# Patient Record
Sex: Male | Born: 1980 | Race: White | Hispanic: No | Marital: Married | Smoking: Never smoker
Health system: Southern US, Community
[De-identification: ages and names within clinical notes are randomized; demographics above are authoritative.]

## PROBLEM LIST (undated history)

## (undated) DIAGNOSIS — Z8719 Personal history of other diseases of the digestive system: Secondary | ICD-10-CM

## (undated) DIAGNOSIS — J302 Other seasonal allergic rhinitis: Secondary | ICD-10-CM

## (undated) DIAGNOSIS — K625 Hemorrhage of anus and rectum: Secondary | ICD-10-CM

## (undated) DIAGNOSIS — Z87898 Personal history of other specified conditions: Secondary | ICD-10-CM

## (undated) DIAGNOSIS — K649 Unspecified hemorrhoids: Secondary | ICD-10-CM

---

## 2006-05-24 ENCOUNTER — Emergency Department: Payer: Self-pay | Admitting: Unknown Physician Specialty

## 2007-03-30 ENCOUNTER — Emergency Department: Payer: Self-pay | Admitting: Emergency Medicine

## 2008-10-15 ENCOUNTER — Emergency Department: Payer: Self-pay | Admitting: Emergency Medicine

## 2011-12-01 ENCOUNTER — Emergency Department: Payer: Self-pay | Admitting: Emergency Medicine

## 2011-12-01 LAB — CK TOTAL AND CKMB (NOT AT ARMC)
CK, Total: 66 U/L (ref 35–232)
CK-MB: 2 ng/mL (ref 0.5–3.6)

## 2012-09-03 ENCOUNTER — Ambulatory Visit: Payer: Self-pay | Admitting: Family Medicine

## 2013-02-19 ENCOUNTER — Emergency Department: Payer: Self-pay | Admitting: Emergency Medicine

## 2013-02-19 LAB — BASIC METABOLIC PANEL
ANION GAP: 3 — AB (ref 7–16)
BUN: 18 mg/dL (ref 7–18)
CHLORIDE: 103 mmol/L (ref 98–107)
Calcium, Total: 9.2 mg/dL (ref 8.5–10.1)
Co2: 33 mmol/L — ABNORMAL HIGH (ref 21–32)
Creatinine: 1.08 mg/dL (ref 0.60–1.30)
EGFR (Non-African Amer.): >60
GLUCOSE: 121 mg/dL — AB (ref 65–99)
OSMOLALITY: 281 (ref 275–301)
POTASSIUM: 3.9 mmol/L (ref 3.5–5.1)
SODIUM: 139 mmol/L (ref 136–145)

## 2013-02-19 LAB — CBC
HCT: 47.6 % (ref 40.0–52.0)
HGB: 15.8 g/dL (ref 13.0–18.0)
MCH: 29.1 pg (ref 26.0–34.0)
MCHC: 33.3 g/dL (ref 32.0–36.0)
MCV: 88 fL (ref 80–100)
PLATELETS: 230 10*3/uL (ref 150–440)
RBC: 5.44 10*6/uL (ref 4.40–5.90)
RDW: 13 % (ref 11.5–14.5)
WBC: 5.9 10*3/uL (ref 3.8–10.6)

## 2013-02-19 LAB — URINALYSIS, COMPLETE
BLOOD: NEGATIVE
Bacteria: NONE SEEN
Bilirubin,UR: NEGATIVE
Glucose,UR: NEGATIVE mg/dL (ref 0–75)
Ketone: NEGATIVE
LEUKOCYTE ESTERASE: NEGATIVE
Nitrite: NEGATIVE
Ph: 6 (ref 4.5–8.0)
Protein: NEGATIVE
RBC,UR: 1 /HPF (ref 0–5)
Specific Gravity: 1.02 (ref 1.003–1.030)
Squamous Epithelial: <1
WBC UR: 1 /HPF (ref 0–5)

## 2013-02-21 LAB — URINE CULTURE

## 2014-05-21 NOTE — Discharge Instructions (Signed)

## 2014-05-24 ENCOUNTER — Ambulatory Visit: Payer: Managed Care, Other (non HMO) | Admitting: Anesthesiology

## 2014-05-24 ENCOUNTER — Encounter
Admission: RE | Disposition: A | Discharge status: Home / Self Care | Payer: Self-pay | Source: Ambulatory Visit | Attending: Gastroenterology

## 2014-05-24 ENCOUNTER — Ambulatory Visit
Admission: RE | Admit: 2014-05-24 | Discharge: 2014-05-24 | Disposition: A | Discharge status: Home / Self Care | Payer: Managed Care, Other (non HMO) | Source: Ambulatory Visit | Attending: Gastroenterology | Admitting: Gastroenterology

## 2014-05-24 DIAGNOSIS — R1031 Right lower quadrant pain: Secondary | ICD-10-CM | POA: Insufficient documentation

## 2014-05-24 DIAGNOSIS — K641 Second degree hemorrhoids: Secondary | ICD-10-CM | POA: Insufficient documentation

## 2014-05-24 DIAGNOSIS — K921 Melena: Secondary | ICD-10-CM | POA: Insufficient documentation

## 2014-05-24 DIAGNOSIS — R1032 Left lower quadrant pain: Secondary | ICD-10-CM | POA: Insufficient documentation

## 2014-05-24 HISTORY — DX: Unspecified hemorrhoids: K64.9

## 2014-05-24 HISTORY — DX: Hemorrhage of anus and rectum: K62.5

## 2014-05-24 HISTORY — DX: Personal history of other diseases of the digestive system: Z87.19

## 2014-05-24 HISTORY — DX: Other seasonal allergic rhinitis: J30.2

## 2014-05-24 HISTORY — DX: Personal history of other specified conditions: Z87.898

## 2014-05-24 HISTORY — PX: COLONOSCOPY: SHX5424

## 2014-05-24 SURGERY — COLONOSCOPY
Anesthesia: Monitor Anesthesia Care | Wound class: Contaminated

## 2014-05-24 MED ORDER — FENTANYL CITRATE (PF) 100 MCG/2ML IJ SOLN: 25.0000 ug | INTRAMUSCULAR | Status: DC | PRN

## 2014-05-24 MED ORDER — LACTATED RINGERS IV SOLN
INTRAVENOUS | Status: DC
  Administered 2014-05-24: 11:00:00 via INTRAVENOUS

## 2014-05-24 MED ORDER — OXYCODONE HCL 5 MG PO TABS: 5.0000 mg | ORAL_TABLET | Freq: Once | ORAL | Status: DC | PRN

## 2014-05-24 MED ORDER — ACETAMINOPHEN 325 MG PO TABS
325.0000 mg | ORAL_TABLET | ORAL | Status: DC | PRN
Start: 2014-05-24 — End: 2014-05-24

## 2014-05-24 MED ORDER — LIDOCAINE HCL (CARDIAC) 20 MG/ML IV SOLN
INTRAVENOUS | Status: DC | PRN
  Administered 2014-05-24: 50 mg via INTRAVENOUS

## 2014-05-24 MED ORDER — STERILE WATER FOR IRRIGATION IR SOLN
Status: DC | PRN
  Administered 2014-05-24: 11:00:00

## 2014-05-24 MED ORDER — OXYCODONE HCL 5 MG/5ML PO SOLN: 5.0000 mg | Freq: Once | ORAL | Status: DC | PRN

## 2014-05-24 MED ORDER — PROPOFOL 10 MG/ML IV BOLUS
INTRAVENOUS | Status: DC | PRN
  Administered 2014-05-24 (×2): 50 mg via INTRAVENOUS
  Administered 2014-05-24 (×2): 100 mg via INTRAVENOUS
  Administered 2014-05-24 (×2): 50 mg via INTRAVENOUS

## 2014-05-24 MED ORDER — ACETAMINOPHEN 160 MG/5ML PO SOLN: 325.0000 mg | ORAL | Status: DC | PRN

## 2014-05-24 MED ORDER — DEXAMETHASONE SODIUM PHOSPHATE 4 MG/ML IJ SOLN: 8.0000 mg | Freq: Once | INTRAMUSCULAR | Status: DC | PRN

## 2014-05-24 SURGICAL SUPPLY — 27 items
CANISTER SUCT 1200ML W/VALVE (MISCELLANEOUS) ×3 IMPLANT
FCP ESCP3.2XJMB 240X2.8X (MISCELLANEOUS)
FORCEPS BIOP RAD 4 LRG CAP 4 (CUTTING FORCEPS) IMPLANT
FORCEPS BIOP RJ4 240 W/NDL (MISCELLANEOUS)
FORCEPS ESCP3.2XJMB 240X2.8X (MISCELLANEOUS) IMPLANT
GOWN CVR UNV OPN BCK APRN NK (MISCELLANEOUS) ×2 IMPLANT
GOWN ISOL THUMB LOOP REG UNIV (MISCELLANEOUS) ×4
HEMOCLIP INSTINCT (CLIP) IMPLANT
INJECTOR VARIJECT VIN23 (MISCELLANEOUS) IMPLANT
KIT CO2 TUBING (TUBING) ×3 IMPLANT
KIT DEFENDO VALVE AND CONN (KITS) IMPLANT
KIT ENDO PROCEDURE OLY (KITS) ×3 IMPLANT
LIGATOR MULTIBAND 6SHOOTER MBL (MISCELLANEOUS) IMPLANT
MARKER SPOT ENDO TATTOO 5ML (MISCELLANEOUS) IMPLANT
PAD GROUND ADULT SPLIT (MISCELLANEOUS) IMPLANT
SNARE SHORT THROW 13M SML OVAL (MISCELLANEOUS) IMPLANT
SNARE SHORT THROW 30M LRG OVAL (MISCELLANEOUS) IMPLANT
SPOT EX ENDOSCOPIC TATTOO (MISCELLANEOUS)
SUCTION POLY TRAP 4CHAMBER (MISCELLANEOUS) IMPLANT
TRAP SUCTION POLY (MISCELLANEOUS) IMPLANT
TUBING CONN 6MMX3.1M (TUBING)
TUBING SUCTION CONN 0.25 STRL (TUBING) IMPLANT
UNDERPAD 30X60 958B10 (PK) (MISCELLANEOUS) IMPLANT
VALVE BIOPSY ENDO (VALVE) IMPLANT
VARIJECT INJECTOR VIN23 (MISCELLANEOUS)
WATER AUXILLARY (MISCELLANEOUS) IMPLANT
WATER STERILE IRR 500ML POUR (IV SOLUTION) ×3 IMPLANT

## 2014-05-24 NOTE — Transfer of Care (Signed)
Immediate Anesthesia Transfer of Care Note  Patient: Calvin Castro  Procedure(s) Performed: Procedure(s) with comments: COLONOSCOPY (N/A) - cecum time- 1113  Patient Location: PACU  Anesthesia Type: MAC  Level of Consciousness: awake, alert  and patient cooperative  Airway and Oxygen Therapy: Patient Spontanous Breathing and Patient connected to supplemental oxygen  Post-op Assessment: Post-op Vital signs reviewed, Patient's Cardiovascular Status Stable, Respiratory Function Stable, Patent Airway and No signs of Nausea or vomiting  Post-op Vital Signs: Reviewed and stable  Complications: No apparent anesthesia complications

## 2014-05-24 NOTE — Anesthesia Procedure Notes (Signed)
Procedure Name: MAC Performed by: Amoree Newlon Pre-anesthesia Checklist: Patient identified, Emergency Drugs available, Suction available, Timeout performed and Patient being monitored Patient Re-evaluated:Patient Re-evaluated prior to inductionOxygen Delivery Method: Nasal cannula Placement Confirmation: positive ETCO2     

## 2014-05-24 NOTE — Anesthesia Postprocedure Evaluation (Signed)
  Anesthesia Post-op Note  Patient: Calvin Castro  Procedure(s) Performed: Procedure(s) with comments: COLONOSCOPY (N/A) - cecum time- 1113  Anesthesia type:MAC  Patient location: PACU  Post pain: Pain level controlled  Post assessment: Post-op Vital signs reviewed, Patient's Cardiovascular Status Stable, Respiratory Function Stable, Patent Airway and No signs of Nausea or vomiting  Post vital signs: Reviewed and stable  Last Vitals:  Filed Vitals:   05/24/14 1123  BP:   Pulse:   Temp: 36.7 C  Resp:     Level of consciousness: awake, alert  and patient cooperative  Complications: No apparent anesthesia complications

## 2014-05-24 NOTE — Anesthesia Preprocedure Evaluation (Signed)
Anesthesia Evaluation  Patient identified by MRN, date of birth, ID band Patient awake    Reviewed: Allergy & Precautions, H&P , NPO status , Patient's Chart, lab work & pertinent test results, reviewed documented beta blocker date and time   Airway Mallampati: II  TM Distance: >3 FB Neck ROM: full    Dental no notable dental hx.    Pulmonary neg pulmonary ROS,  breath sounds clear to auscultation  Pulmonary exam normal       Cardiovascular Exercise Tolerance: Good negative cardio ROS  Rhythm:regular Rate:Normal     Neuro/Psych negative neurological ROS  negative psych ROS   GI/Hepatic negative GI ROS, Neg liver ROS,   Endo/Other  negative endocrine ROS  Renal/GU negative Renal ROS  negative genitourinary   Musculoskeletal   Abdominal   Peds  Hematology negative hematology ROS (+)   Anesthesia Other Findings   Reproductive/Obstetrics negative OB ROS                             Anesthesia Physical Anesthesia Plan  ASA: II  Anesthesia Plan: MAC   Post-op Pain Management:    Induction:   Airway Management Planned:   Additional Equipment:   Intra-op Plan:   Post-operative Plan:   Informed Consent: I have reviewed the patients History and Physical, chart, labs and discussed the procedure including the risks, benefits and alternatives for the proposed anesthesia with the patient or authorized representative who has indicated his/her understanding and acceptance.     Plan Discussed with: CRNA  Anesthesia Plan Comments:         Anesthesia Quick Evaluation

## 2014-05-24 NOTE — Op Note (Signed)
[facility] Patient Name: Calvin Castro Procedure Date: 05/24/2014 10:57 AM MRN: [id number] Account #: [id number] Date of Birth: 1980-07-15 Admit Type: Outpatient Age: 34 [facility] Gender: Male Note Status: Finalized Procedure:         Colonoscopy Indications:       Abdominal pain in the left lower quadrant, Abdominal pain                     in the right lower quadrant, Hematochezia Providers:         Midge Miniumarren Bhargav Barbaro, MD Referring MD:      Janeann ForehandJames H. Hawkins Jr, MD (Referring MD) Medicines:         Propofol per Anesthesia Complications:     No immediate complications. Procedure:         Pre-Anesthesia Assessment:                    - Prior to the procedure, a History and Physical was                     performed, and patient medications and allergies were                     reviewed. The patient's tolerance of previous anesthesia                     was also reviewed. The risks and benefits of the procedure                     and the sedation options and risks were discussed with the                     patient. All questions were answered, and informed consent                     was obtained. Prior Anticoagulants: The patient has taken                     no previous anticoagulant or antiplatelet agents. ASA                     Grade Assessment: I - A normal, healthy patient. After                     reviewing the risks and benefits, the patient was deemed                     in satisfactory condition to undergo the procedure.                    After obtaining informed consent, the colonoscope was                     passed under direct vision. Throughout the procedure, the                     patient's blood pressure, pulse, and oxygen saturations                     were monitored continuously. The Olympus CF H180AL                     colonoscope (S#: [id number]) was introduced through the anus  and advanced to the  the cecum, identified by appendiceal                     orifice and ileocecal valve. The colonoscopy was performed                     without difficulty. The patient tolerated the procedure                     well. The quality of the bowel preparation was excellent. Findings:      The perianal and digital rectal examinations were normal.      Non-bleeding internal hemorrhoids were found during retroflexion. The       hemorrhoids were Grade II (internal hemorrhoids that prolapse but reduce       spontaneously). Impression:        - Non-bleeding internal hemorrhoids.                    - No specimens collected. Recommendation:    - High fiber diet. Procedure Code(s): --- Professional ---                    [phone], Colonoscopy, flexible; diagnostic, including                     collection of specimen(s) by brushing or washing, when                     performed (separate procedure) Diagnosis Code(s): --- Professional ---                    R10.32, Left lower quadrant pain                    R10.31, Right lower quadrant pain                    K92.1, Melena CPT copyright 2014 American Medical Association. All rights reserved. The codes documented in this report are preliminary and upon coder review may  be revised to meet current compliance requirements. Midge Miniumarren Ozelle Brubacher, MD 05/24/2014 11:21:54 AM This report has been signed electronically. Number of Addenda: 0 Note Initiated On: 05/24/2014 10:57 AM Scope Withdrawal Time: 0 hours 6 minutes 37 seconds  Total Procedure Duration: 0 hours 12 minutes 58 seconds       [facility]

## 2014-05-26 ENCOUNTER — Encounter: Payer: Self-pay | Admitting: Gastroenterology

## 2015-02-08 ENCOUNTER — Ambulatory Visit (INDEPENDENT_AMBULATORY_CARE_PROVIDER_SITE_OTHER): Payer: Managed Care, Other (non HMO) | Admitting: Family Medicine

## 2015-02-08 VITALS — BP 112/73 | HR 82 | Temp 98.1°F | Resp 16 | Ht 70.0 in | Wt 138.6 lb

## 2015-02-08 DIAGNOSIS — R197 Diarrhea, unspecified: Secondary | ICD-10-CM | POA: Diagnosis not present

## 2015-02-08 DIAGNOSIS — R1030 Lower abdominal pain, unspecified: Secondary | ICD-10-CM

## 2015-02-08 DIAGNOSIS — R109 Unspecified abdominal pain: Secondary | ICD-10-CM

## 2015-02-08 LAB — POCT URINALYSIS DIPSTICK
BILIRUBIN UA: NEGATIVE
Glucose, UA: NEGATIVE
KETONES UA: NEGATIVE
Leukocytes, UA: NEGATIVE
NITRITE UA: NEGATIVE
PH UA: 5
Protein, UA: NEGATIVE
RBC UA: NEGATIVE
SPEC GRAV UA: 1.015
Urobilinogen, UA: NEGATIVE

## 2015-02-08 NOTE — Patient Instructions (Signed)
Lets get some lab work to work up the source of your symptoms. I also think a CT scan of your abdomen might be helpful to see what is going on.  IF you have severe abdominal pain, nausea, vomiting, blood in urine or stool- please seek immediate medical attention.

## 2015-02-08 NOTE — Progress Notes (Signed)
Subjective:    Patient ID: Calvin Castro, male    DOB: [birth year], 35 y.o.   MRN: [id number]  HPI: Calvin Castro is a 35 y.o. male presenting on 02/08/2015 for Abdominal Pain   HPI  Pt presents for lower abdominal pain R> L.- pain- radiates to groin at times. Symptoms have been present for a while- pain is intermittent. Gets sharp pain radiating from flankHe is also having back pain. Back pain is present on and off for about 6 mos. Colonoscopy in May 2016 for rectal bleeding and hemorrhoids.  It was normal. Was told to increase fiber in his diet.   No dysuria, no blood in the urine. R flank pain radiating to the groin.  BM's- Pain on R side- has large BM after-no blood in the stool. Loose BM.    Has been sleepy and not motivated. Feels like his blood sugars are dropping.   Past Medical History  Diagnosis Date  . Seasonal allergies   . Rectal bleeding   . Hemorrhoids   . History of IBS   . Hx of diarrhea     No current outpatient prescriptions on file prior to visit.   No current facility-administered medications on file prior to visit.    Review of Systems  Constitutional: Negative for fever and chills.  HENT: Negative.   Respiratory: Negative for chest tightness, shortness of breath and wheezing.   Cardiovascular: Negative for chest pain, palpitations and leg swelling.  Gastrointestinal: Positive for abdominal pain and anal bleeding (occasional smears from anal fissures. ). Negative for nausea, vomiting and blood in stool.  Endocrine: Negative.   Genitourinary: Positive for flank pain. Negative for dysuria, urgency, discharge, difficulty urinating, penile pain and testicular pain.  Musculoskeletal: Negative for back pain, joint swelling and arthralgias.  Skin: Negative.   Neurological: Negative for dizziness, weakness, numbness and headaches.  Psychiatric/Behavioral: Negative for sleep disturbance and dysphoric mood.   Per HPI unless specifically indicated above       Objective:    BP 112/73 mmHg  Pulse 82  Temp(Src) 98.1 F (36.7 C) (Oral)  Resp 16  Ht  (1.778 m)  Wt 138 lb 9.6 oz (62.869 kg)  BMI 19.89 kg/m2  Wt Readings from Last 3 Encounters:  02/08/15 138 lb 9.6 oz (62.869 kg)  05/24/14 127 lb (57.607 kg)    Physical Exam  Constitutional: He is oriented to person, place, and time. He appears well-developed and well-nourished. No distress.  HENT:  Head: Normocephalic and atraumatic.  Neck: Neck supple. No thyromegaly present.  Cardiovascular: Normal rate, regular rhythm and normal heart sounds.  Exam reveals no gallop and no friction rub.   No murmur heard. Pulmonary/Chest: Effort normal and breath sounds normal. He has no wheezes.  Abdominal: Soft. Bowel sounds are normal. He exhibits no distension. There is tenderness in the right lower quadrant. There is CVA tenderness (L and R). There is no rebound.  Musculoskeletal: Normal range of motion. He exhibits no edema or tenderness.  Neurological: He is alert and oriented to person, place, and time. He has normal reflexes.  Skin: Skin is warm and dry. No rash noted. No erythema.  Psychiatric: He has a normal mood and affect. His behavior is normal. Thought content normal.   Results for orders placed or performed in visit on 02/08/15  POCT Urinalysis Dipstick  Result Value Ref Range   Color, UA dark yellow    Clarity, UA clear    Glucose, UA negative  Bilirubin, UA negative    Ketones, UA negative    Spec Grav, UA 1.015    Blood, UA negative    pH, UA 5.0    Protein, UA negative    Urobilinogen, UA negative    Nitrite, UA negative    Leukocytes, UA Negative Negative      Assessment & Plan:   Problem List Items Addressed This Visit    None    Visit Diagnoses    Lower abdominal pain    -  Primary    Kidney stone vs. IBS- CMET, CBC, Consider CT imaging of abdomen. Colonscopy normal. Consider GI referral for IBS work-up.    Relevant Orders    POCT Urinalysis Dipstick  (Completed)    Comprehensive metabolic panel    CBC with Differential/Platelet    Amylase    Lipase    Right flank pain        Existing for months- possible kidney stone vs infection. Urine culture pending. Consider CT.     Relevant Orders    CULTURE, URINE COMPREHENSIVE    Diarrhea, unspecified type        Relevant Orders    TSH       No orders of the defined types were placed in this encounter.      Follow up plan: Return in about 2 weeks (around 02/22/2015) for abdominal pain. Marland Kitchen

## 2015-02-10 LAB — CULTURE, URINE COMPREHENSIVE

## 2015-02-11 LAB — COMPREHENSIVE METABOLIC PANEL
ALK PHOS: 64 IU/L (ref 39–117)
ALT: 14 IU/L (ref 0–44)
AST: 15 IU/L (ref 0–40)
Albumin/Globulin Ratio: 2.1 (ref 1.1–2.5)
Albumin: 4.8 g/dL (ref 3.5–5.5)
BUN / CREAT RATIO: 19 (ref 8–19)
BUN: 16 mg/dL (ref 6–20)
Bilirubin Total: 0.8 mg/dL (ref 0.0–1.2)
CO2: 26 mmol/L (ref 18–29)
CREATININE: 0.85 mg/dL (ref 0.76–1.27)
Calcium: 9.4 mg/dL (ref 8.7–10.2)
Chloride: 98 mmol/L (ref 96–106)
GFR calc Af Amer: 132 mL/min/{1.73_m2} (ref >59)
GFR calc non Af Amer: 114 mL/min/{1.73_m2} (ref >59)
GLUCOSE: 111 mg/dL — AB (ref 65–99)
Globulin, Total: 2.3 g/dL (ref 1.5–4.5)
Potassium: 4.1 mmol/L (ref 3.5–5.2)
Sodium: 142 mmol/L (ref 134–144)
Total Protein: 7.1 g/dL (ref 6.0–8.5)

## 2015-02-11 LAB — CBC WITH DIFFERENTIAL/PLATELET
Basophils Absolute: 0 10*3/uL (ref 0.0–0.2)
Basos: 1 %
EOS (ABSOLUTE): 0.2 10*3/uL (ref 0.0–0.4)
EOS: 4 %
HEMATOCRIT: 45.7 % (ref 37.5–51.0)
HEMOGLOBIN: 15.3 g/dL (ref 12.6–17.7)
IMMATURE GRANS (ABS): 0 10*3/uL (ref 0.0–0.1)
Immature Granulocytes: 0 %
LYMPHS ABS: 1.6 10*3/uL (ref 0.7–3.1)
LYMPHS: 30 %
MCH: 29.1 pg (ref 26.6–33.0)
MCHC: 33.5 g/dL (ref 31.5–35.7)
MCV: 87 fL (ref 79–97)
MONOCYTES: 7 %
Monocytes Absolute: 0.4 10*3/uL (ref 0.1–0.9)
Neutrophils Absolute: 3.2 10*3/uL (ref 1.4–7.0)
Neutrophils: 58 %
PLATELETS: 230 10*3/uL (ref 150–379)
RBC: 5.25 x10E6/uL (ref 4.14–5.80)
RDW: 13.3 % (ref 12.3–15.4)
WBC: 5.4 10*3/uL (ref 3.4–10.8)

## 2015-02-11 LAB — AMYLASE: Amylase: 51 U/L (ref 31–124)

## 2015-02-11 LAB — LIPASE: Lipase: 32 U/L (ref 0–59)

## 2015-02-11 LAB — TSH: TSH: 1.51 u[IU]/mL (ref 0.450–4.500)

## 2015-02-14 ENCOUNTER — Telehealth: Payer: Self-pay | Admitting: Family Medicine

## 2015-02-14 NOTE — Telephone Encounter (Signed)
Called pt regarding lab results and next steps.

## 2015-02-16 LAB — SPECIMEN STATUS REPORT

## 2015-02-16 LAB — HGB A1C W/O EAG: Hgb A1c MFr Bld: 5.4 % (ref 4.8–5.6)

## 2015-02-16 NOTE — Telephone Encounter (Signed)
Called pt to review lab results. LMTCB.  If he calls back, please let him know his A1c is normal. No diabetes.  Also- the plan was to order a CT of his abdomen to work-up the abdominal pain. Then proceed with GI consult if we could not find anything. He is welcome to move up his appt if he doesn't want to wait until Monday. I would be happy to work him in on Thursday or Friday at anytime. Thanks! AK

## 2015-02-16 NOTE — Telephone Encounter (Signed)
Calvin Castro left messege and pt called as per Calvin Castro we can schedule his  appointment on 02/17/2015 at 8 or 8:30 am LMTCB and Liborio Nixon aware

## 2015-02-17 ENCOUNTER — Ambulatory Visit (INDEPENDENT_AMBULATORY_CARE_PROVIDER_SITE_OTHER): Payer: Managed Care, Other (non HMO) | Admitting: Family Medicine

## 2015-02-17 VITALS — BP 108/70 | HR 65 | Temp 97.7°F | Resp 16 | Ht 70.0 in | Wt 138.0 lb

## 2015-02-17 DIAGNOSIS — R1030 Lower abdominal pain, unspecified: Secondary | ICD-10-CM | POA: Diagnosis not present

## 2015-02-17 DIAGNOSIS — K589 Irritable bowel syndrome without diarrhea: Secondary | ICD-10-CM

## 2015-02-17 DIAGNOSIS — G8929 Other chronic pain: Secondary | ICD-10-CM | POA: Diagnosis not present

## 2015-02-17 DIAGNOSIS — R1011 Right upper quadrant pain: Secondary | ICD-10-CM | POA: Diagnosis not present

## 2015-02-17 DIAGNOSIS — R109 Unspecified abdominal pain: Principal | ICD-10-CM

## 2015-02-17 MED ORDER — CULTURELLE DIGESTIVE HEALTH PO CAPS: 1.0000 | ORAL_CAPSULE | Freq: Every day | ORAL | Status: DC

## 2015-02-17 MED ORDER — MELOXICAM 15 MG PO TABS: 15.0000 mg | ORAL_TABLET | Freq: Every day | ORAL | Status: DC

## 2015-02-17 MED ORDER — AMITRIPTYLINE HCL 10 MG PO TABS: 10.0000 mg | ORAL_TABLET | Freq: Every day | ORAL | Status: DC

## 2015-02-17 NOTE — Assessment & Plan Note (Signed)
Obtain CT imaging to rule out kidney stone or abdominal issue as source of pain. Trial of mleoxicam to relieve pain.

## 2015-02-17 NOTE — Progress Notes (Signed)
Subjective:    Patient ID: Calvin Castro, male    DOB: 11/21/1980, 35 y.o.   MRN: [id number]  HPI: Calvin Castro is a 35 y.o. male presenting on 02/17/2015 for Back Pain   HPI  Pt presents for follow-up and R flank and abdominal pain. Pain is intermittent and radiates from R flank to lower abdomen. Dull ache in the R flank. Not affected by movement. Abdominal pain is not constant- occurs in the low abdomen. He cannot make a pattern with what he eats. Has used bentyl in the past for abdominal pain and it has never helped. Abdominal pain feels better after a bowel movement.   Pt does report feeling a little anxious about results.   Past Medical History  Diagnosis Date  . Seasonal allergies   . Rectal bleeding   . Hemorrhoids   . History of IBS   . Hx of diarrhea     No current outpatient prescriptions on file prior to visit.   No current facility-administered medications on file prior to visit.    Review of Systems  Constitutional: Negative for fever and chills.  HENT: Negative.   Respiratory: Negative for chest tightness, shortness of breath and wheezing.   Cardiovascular: Negative for chest pain, palpitations and leg swelling.  Gastrointestinal: Positive for abdominal pain. Negative for nausea, vomiting and constipation.  Endocrine: Negative.   Genitourinary: Positive for flank pain. Negative for dysuria, urgency, discharge, penile pain and testicular pain.  Musculoskeletal: Negative for back pain, joint swelling and arthralgias.  Skin: Negative.   Neurological: Negative for dizziness, weakness, numbness and headaches.  Psychiatric/Behavioral: Negative for sleep disturbance and dysphoric mood. The patient is nervous/anxious.    Per HPI unless specifically indicated above     Objective:    BP 108/70 mmHg  Pulse 65  Temp(Src) 97.7 F (36.5 C) (Oral)  Resp 16  Ht  (1.778 m)  Wt 138 lb (62.596 kg)  BMI 19.80 kg/m2  Wt Readings from Last 3 Encounters:  02/17/15  138 lb (62.596 kg)  02/08/15 138 lb 9.6 oz (62.869 kg)  05/24/14 127 lb (57.607 kg)    Physical Exam  Constitutional: He is oriented to person, place, and time. He appears well-developed and well-nourished. No distress.  HENT:  Head: Normocephalic and atraumatic.  Neck: Neck supple. No thyromegaly present.  Cardiovascular: Normal rate, regular rhythm and normal heart sounds.  Exam reveals no gallop and no friction rub.   No murmur heard. Pulmonary/Chest: Effort normal and breath sounds normal. He has no wheezes.  Abdominal: Soft. Bowel sounds are normal. He exhibits no distension. There is no tenderness. There is CVA tenderness. There is no rebound.  Musculoskeletal: Normal range of motion. He exhibits no edema or tenderness.  Neurological: He is alert and oriented to person, place, and time. He has normal reflexes.  Skin: Skin is warm and dry. No rash noted. No erythema.  Psychiatric: He has a normal mood and affect. His behavior is normal. Thought content normal.   Results for orders placed or performed in visit on 02/08/15  CULTURE, URINE COMPREHENSIVE  Result Value Ref Range   Urine Culture, Comprehensive Final report    Result 1 Comment   Comprehensive metabolic panel  Result Value Ref Range   Glucose 111 (H) 65 - 99 mg/dL   BUN 16 6 - 20 mg/dL   Creatinine, Ser 8.46 0.76 - 1.27 mg/dL   GFR calc non Af Amer 114 >59 mL/min/1.73   GFR calc Af Denyse Dago  132 >59 mL/min/1.73   BUN/Creatinine Ratio 19 8 - 19   Sodium 142 134 - 144 mmol/L   Potassium 4.1 3.5 - 5.2 mmol/L   Chloride 98 96 - 106 mmol/L   CO2 26 18 - 29 mmol/L   Calcium 9.4 8.7 - 10.2 mg/dL   Total Protein 7.1 6.0 - 8.5 g/dL   Albumin 4.8 3.5 - 5.5 g/dL   Globulin, Total 2.3 1.5 - 4.5 g/dL   Albumin/Globulin Ratio 2.1 1.1 - 2.5   Bilirubin Total 0.8 0.0 - 1.2 mg/dL   Alkaline Phosphatase 64 39 - 117 IU/L   AST 15 0 - 40 IU/L   ALT 14 0 - 44 IU/L  CBC with Differential/Platelet  Result Value Ref Range   WBC 5.4 3.4  - 10.8 x10E3/uL   RBC 5.25 4.14 - 5.80 x10E6/uL   Hemoglobin 15.3 12.6 - 17.7 g/dL   Hematocrit 08.6 57.8 - 51.0 %   MCV 87 79 - 97 fL   MCH 29.1 26.6 - 33.0 pg   MCHC 33.5 31.5 - 35.7 g/dL   RDW 46.9 62.9 - 52.8 %   Platelets 230 150 - 379 x10E3/uL   Neutrophils 58 %   Lymphs 30 %   Monocytes 7 %   Eos 4 %   Basos 1 %   Neutrophils Absolute 3.2 1.4 - 7.0 x10E3/uL   Lymphocytes Absolute 1.6 0.7 - 3.1 x10E3/uL   Monocytes Absolute 0.4 0.1 - 0.9 x10E3/uL   EOS (ABSOLUTE) 0.2 0.0 - 0.4 x10E3/uL   Basophils Absolute 0.0 0.0 - 0.2 x10E3/uL   Immature Granulocytes 0 %   Immature Grans (Abs) 0.0 0.0 - 0.1 x10E3/uL  Amylase  Result Value Ref Range   Amylase 51 31 - 124 U/L  Lipase  Result Value Ref Range   Lipase 32 0 - 59 U/L  TSH  Result Value Ref Range   TSH 1.510 0.450 - 4.500 uIU/mL  Hgb A1c w/o eAG  Result Value Ref Range   Hgb A1c MFr Bld 5.4 4.8 - 5.6 %  Specimen status report  Result Value Ref Range   specimen status report Comment   POCT Urinalysis Dipstick  Result Value Ref Range   Color, UA dark yellow    Clarity, UA clear    Glucose, UA negative    Bilirubin, UA negative    Ketones, UA negative    Spec Grav, UA 1.015    Blood, UA negative    pH, UA 5.0    Protein, UA negative    Urobilinogen, UA negative    Nitrite, UA negative    Leukocytes, UA Negative Negative      Assessment & Plan:   Problem List Items Addressed This Visit      Digestive   IBS (irritable bowel syndrome)    Symptoms consistent with IBS as triggered by anxiety. Trial of amitriptyline  at bedtime to help with sleep and abdominal symptoms.  Recommend probiotic. Trial of fodmap diet.  Trial of gluten free.       Relevant Medications   Lactobacillus-Inulin (CULTURELLE DIGESTIVE HEALTH) CAPS   amitriptyline (ELAVIL) 10 MG tablet     Other   Right flank pain, chronic - Primary    Obtain CT imaging to rule out kidney stone or abdominal issue as source of pain. Trial of  mleoxicam to relieve pain.       Relevant Medications   meloxicam (MOBIC) 15 MG tablet   Other Relevant Orders   CT Abdomen  Pelvis W Contrast   Lower abdominal pain    CT scan to evaluate abdominal pain. Colonoscopy last year was WNL. Likely IBS or diet related.        Relevant Orders   CT Abdomen Pelvis W Contrast      Meds ordered this encounter  Medications  . meloxicam (MOBIC) 15 MG tablet    Sig: Take 1 tablet (15 mg total) by mouth daily.    Dispense:  30 tablet    Refill:  0    Order Specific Question:  Supervising Provider    Answer:  Janeann Forehand [phone]  . Lactobacillus-Inulin (CULTURELLE DIGESTIVE HEALTH) CAPS    Sig: Take 1 capsule by mouth daily.    Order Specific Question:  Supervising Provider    Answer:  Janeann Forehand [[id number]]  . amitriptyline (ELAVIL) 10 MG tablet    Sig: Take 1 tablet (10 mg total) by mouth at bedtime.    Dispense:  30 tablet    Refill:  11    Order Specific Question:  Supervising Provider    Answer:  Janeann Forehand [phone]      Follow up plan: Return in about 4 weeks (around 03/17/2015) for IBS.Marland Kitchen

## 2015-02-17 NOTE — Assessment & Plan Note (Signed)
Symptoms consistent with IBS as triggered by anxiety. Trial of amitriptyline  at bedtime to help with sleep and abdominal symptoms.  Recommend probiotic. Trial of fodmap diet.  Trial of gluten free.

## 2015-02-17 NOTE — Patient Instructions (Addendum)
Let's try amitriptyline at bedtime to see if it helps your symptoms.   Try taking a probiotic daily.   Let's try meloxicam for the back pain.  It may help you feel better.   We will obtain imaging of your belly and your back.

## 2015-02-17 NOTE — Assessment & Plan Note (Signed)
CT scan to evaluate abdominal pain. Colonoscopy last year was WNL. Likely IBS or diet related.

## 2015-02-21 ENCOUNTER — Telehealth: Payer: Self-pay | Admitting: Family Medicine

## 2015-02-21 ENCOUNTER — Ambulatory Visit: Payer: Managed Care, Other (non HMO) | Admitting: Family Medicine

## 2015-02-21 NOTE — Telephone Encounter (Signed)
Pt. States that the medication that was given to him on  Friday Elavil  10 mg  Was  causing his arms to go numb, from elbow to hand to turn red,hard time  Sleeping pt call back # is  [phone]

## 2015-02-21 NOTE — Telephone Encounter (Signed)
Pt stopped taking due to side effects. Pt does not want to start a new medication at this time. We will proceed with imaging of his belly.

## 2015-03-02 ENCOUNTER — Telehealth: Payer: Self-pay | Admitting: Family Medicine

## 2015-03-02 NOTE — Telephone Encounter (Signed)
Tried x 2 to contact [place] w/o success. Authorization information has been entered in order for CT.

## 2015-03-02 NOTE — Telephone Encounter (Signed)
[facility] said pt's needs a precert of CT scheduled for tomorrow.  Her call back number is [phone]

## 2015-03-02 NOTE — Telephone Encounter (Signed)
Calvin Castro in scheduling at [facility] said it was past cut off time for scheduled appt tomorrow.  After precert is done, they will reschedule.  Victorino Dike has a few questions, please call [phone]

## 2015-03-02 NOTE — Telephone Encounter (Signed)
R/T call to Kylie to let her know patient has been approved for CT scan though it has been cancelled. Auth # [id number] Valid 2/15-5/16/17 Case # [id number] DX. R10.11

## 2015-03-03 ENCOUNTER — Ambulatory Visit: Admission: RE | Admit: 2015-03-03 | Payer: Managed Care, Other (non HMO) | Source: Ambulatory Visit

## 2015-03-04 NOTE — Telephone Encounter (Signed)
Left patient detailed message letting him know CT has been approved. He needs to call scheduling back to reschedule procedure.

## 2015-05-09 IMAGING — CT CT STONE STUDY
1 of 4 series · 4 of 46 positions shown, 9 images · non-contrast
Comparison: None.

CLINICAL DATA: Right flank pain.

EXAM:
CT ABDOMEN AND PELVIS WITHOUT CONTRAST
TECHNIQUE: Multidetector CT imaging of the abdomen and pelvis was performed
following the standard protocol without IV contrast.

[Series 4: lung windows · axial · 0.57mm/px · z∈[-612,-552]mm · 4 of 21 slices shown, 9 images]
[im 5/21  soft-tissue]
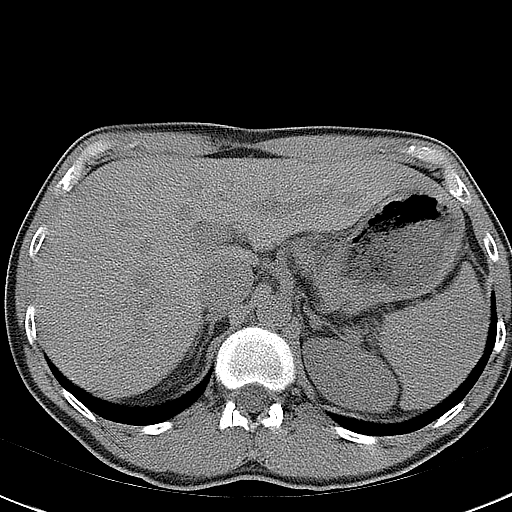
[im 5/21  lung]
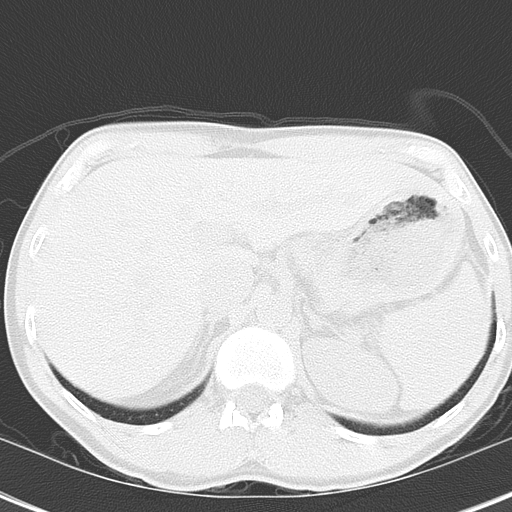
[im 5/21  bone]
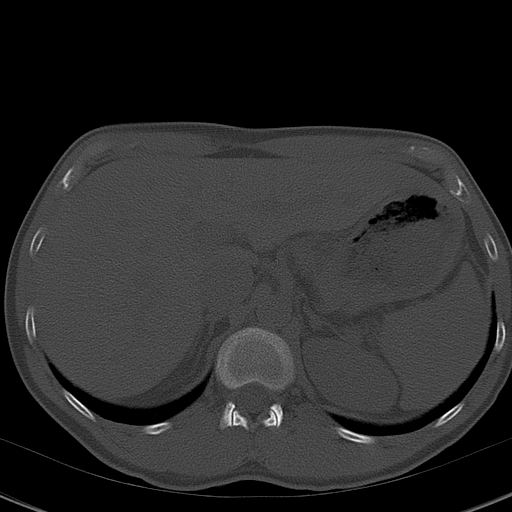
[im 9/21  soft-tissue]
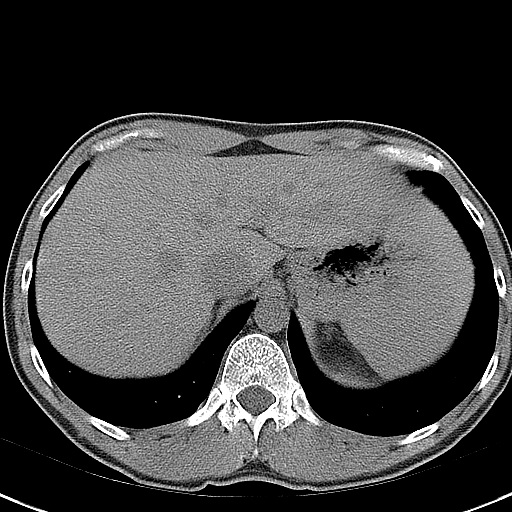
[im 9/21  lung]
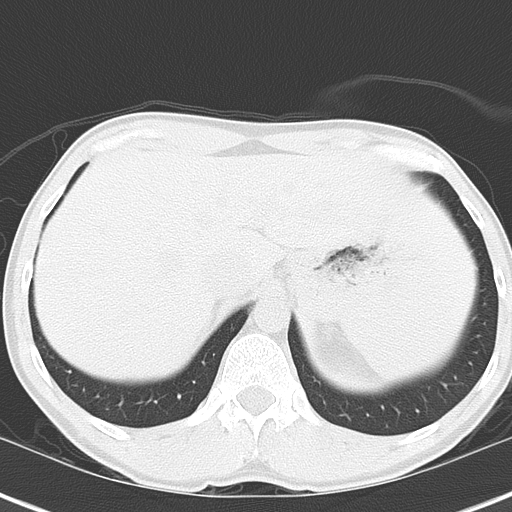
[im 13/21  soft-tissue]
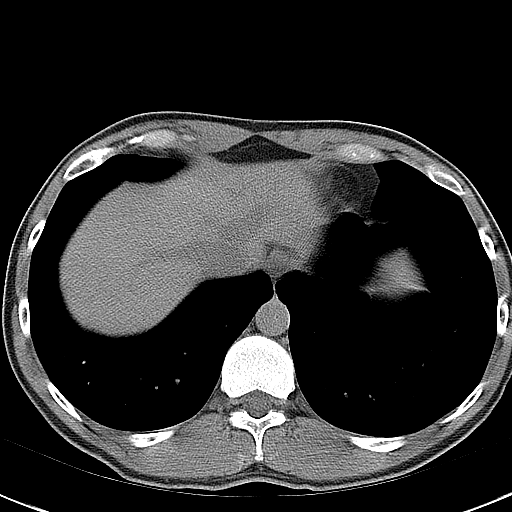
[im 13/21  lung]
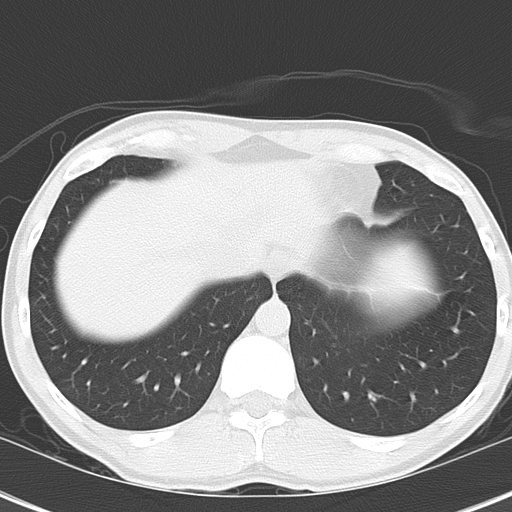
[im 17/21  soft-tissue]
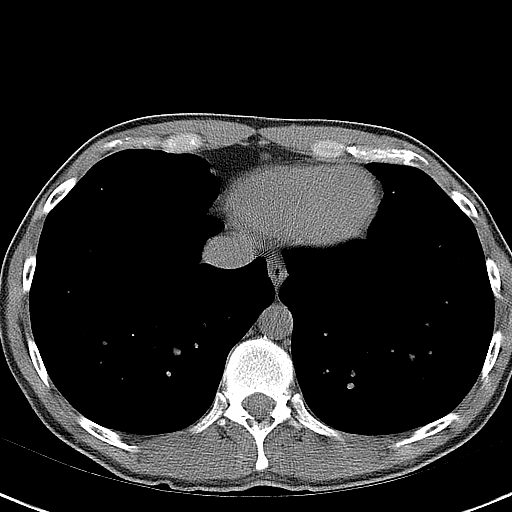
[im 17/21  lung]
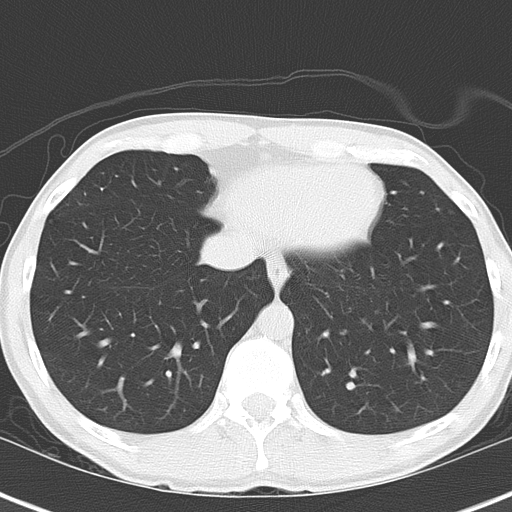

[4 of 46 positions shown; findings below may reference images not displayed]

FINDINGS: The lung bases are clear.  No pleural or pericardial effusion.

There is no hydronephrosis on the right or left. No renal or
ureteral stones are identified.

The gallbladder, liver, spleen, adrenal glands and pancreas appear
normal. The appendix is visualized and appears normal. The stomach
and small and large bowel appear normal. There is no lymphadenopathy
or fluid. No bony abnormality is identified.
IMPRESSION: Negative for urinary tract stone.  Negative CT abdomen and pelvis.

## 2016-06-11 ENCOUNTER — Encounter: Payer: Self-pay | Admitting: *Deleted

## 2016-06-11 ENCOUNTER — Ambulatory Visit
Admission: EM | Admit: 2016-06-11 | Discharge: 2016-06-11 | Disposition: A | Discharge status: Home / Self Care | Payer: Managed Care, Other (non HMO) | Attending: Family Medicine | Admitting: Family Medicine

## 2016-06-11 DIAGNOSIS — R112 Nausea with vomiting, unspecified: Secondary | ICD-10-CM | POA: Diagnosis not present

## 2016-06-11 LAB — COMPREHENSIVE METABOLIC PANEL
ALBUMIN: 4.6 g/dL (ref 3.5–5.0)
ALK PHOS: 61 U/L (ref 38–126)
ALT: 16 U/L — ABNORMAL LOW (ref 17–63)
ANION GAP: 9 (ref 5–15)
AST: 24 U/L (ref 15–41)
BUN: 15 mg/dL (ref 6–20)
CALCIUM: 8.7 mg/dL — AB (ref 8.9–10.3)
CO2: 25 mmol/L (ref 22–32)
Chloride: 102 mmol/L (ref 101–111)
Creatinine, Ser: 0.96 mg/dL (ref 0.61–1.24)
Glucose, Bld: 122 mg/dL — ABNORMAL HIGH (ref 65–99)
Potassium: 3.1 mmol/L — ABNORMAL LOW (ref 3.5–5.1)
Sodium: 136 mmol/L (ref 135–145)
TOTAL PROTEIN: 7.8 g/dL (ref 6.5–8.1)
Total Bilirubin: 1.3 mg/dL — ABNORMAL HIGH (ref 0.3–1.2)

## 2016-06-11 LAB — CBC WITH DIFFERENTIAL/PLATELET
BASOS PCT: 0 %
Basophils Absolute: 0 10*3/uL (ref 0–0.1)
Eosinophils Absolute: 0 10*3/uL (ref 0–0.7)
Eosinophils Relative: 0 %
HCT: 48 % (ref 40.0–52.0)
Hemoglobin: 16.3 g/dL (ref 13.0–18.0)
LYMPHS ABS: 0.4 10*3/uL — AB (ref 1.0–3.6)
Lymphocytes Relative: 6 %
MCH: 29 pg (ref 26.0–34.0)
MCHC: 34 g/dL (ref 32.0–36.0)
MCV: 85.3 fL (ref 80.0–100.0)
MONO ABS: 0.6 10*3/uL (ref 0.2–1.0)
Monocytes Relative: 8 %
Neutro Abs: 5.8 10*3/uL (ref 1.4–6.5)
Neutrophils Relative %: 86 %
Platelets: 183 10*3/uL (ref 150–440)
RBC: 5.63 MIL/uL (ref 4.40–5.90)
RDW: 13.1 % (ref 11.5–14.5)
WBC: 6.8 10*3/uL (ref 3.8–10.6)

## 2016-06-11 MED ORDER — ONDANSETRON 8 MG PO TBDP: 8.0000 mg | ORAL_TABLET | Freq: Three times a day (TID) | ORAL | 0 refills | Status: DC | PRN

## 2016-06-11 MED ORDER — ONDANSETRON 8 MG PO TBDP
8.0000 mg | ORAL_TABLET | Freq: Once | ORAL | Status: AC
  Administered 2016-06-11: 8 mg via ORAL

## 2016-06-11 NOTE — Discharge Instructions (Signed)
Pedialyte or Gatorade, clear liquids then advance diet slowly as tolerated Go to Emergency Department if symptoms worsen

## 2016-06-11 NOTE — ED Triage Notes (Signed)
Patient started having symptoms of severe nausea and vomiting today.

## 2016-06-11 NOTE — ED Provider Notes (Signed)
[facility]    CSN: [id number] Arrival date & time: 06/11/16  1911     History   Chief Complaint Chief Complaint  Patient presents with  . Emesis  . Nausea    HPI Calvin Castro is a 36 y.o. male.   The history is provided by the patient.  Emesis  Severity:  Moderate Duration:  12 hours Timing:  Intermittent Quality:  Stomach contents Progression:  Unchanged Chronicity:  New Recent urination:  Decreased Relieved by:  None tried Ineffective treatments:  None tried Associated symptoms: abdominal pain and chills   Associated symptoms: no arthralgias, no cough, no diarrhea, no fever, no headaches, no myalgias, no sore throat and no URI   Risk factors: suspect food intake (ate out last night)   Risk factors: no alcohol use, no diabetes, no prior abdominal surgery, no sick contacts and no travel to endemic areas     Past Medical History:  Diagnosis Date  . Hemorrhoids   . History of IBS   . Hx of diarrhea   . Rectal bleeding   . Seasonal allergies     Patient Active Problem List   Diagnosis Date Noted  . Right flank pain, chronic 02/17/2015  . Lower abdominal pain 02/17/2015  . IBS (irritable bowel syndrome) 02/17/2015    Past Surgical History:  Procedure Laterality Date  . COLONOSCOPY N/A 05/24/2014   Procedure: COLONOSCOPY;  Surgeon: Midge Minium, MD;  Location: [facility];  Service: Gastroenterology;  Laterality: N/A;  cecum time- 1113       Home Medications    Prior to Admission medications   Medication Sig Start Date End Date Taking? Authorizing Provider  Lactobacillus-Inulin (CULTURELLE DIGESTIVE HEALTH) CAPS Take 1 capsule by mouth daily. 02/17/15   Loura Pardon, NP  meloxicam (MOBIC) 15 MG tablet Take 1 tablet (15 mg total) by mouth daily. 02/17/15   Krebs, Laurel Dimmer, NP  ondansetron (ZOFRAN ODT) 8 MG disintegrating tablet Take 1 tablet (8 mg total) by mouth every 8 (eight) hours as needed. 06/11/16   Payton Mccallum, MD     Family History History reviewed. No pertinent family history.  Social History Social History  Substance Use Topics  . Smoking status: Never Smoker  . Smokeless tobacco: Never Used  . Alcohol use 0.6 - 1.2 oz/week    1 - 2 Cans of beer per week     Allergies   Patient has no known allergies.   Review of Systems Review of Systems  Constitutional: Positive for chills. Negative for fever.  HENT: Negative for sore throat.   Respiratory: Negative for cough.   Gastrointestinal: Positive for abdominal pain and vomiting. Negative for diarrhea.  Musculoskeletal: Negative for arthralgias and myalgias.  Neurological: Negative for headaches.     Physical Exam Triage Vital Signs ED Triage Vitals  Enc Vitals Group     BP 06/11/16 2018 128/76     Pulse Rate 06/11/16 2018 (!) 103     Resp 06/11/16 2018 18     Temp 06/11/16 2018 99.3 F (37.4 C)     Temp Source 06/11/16 2018 Oral     SpO2 06/11/16 2018 100 %     Weight 06/11/16 2019 135 lb (61.2 kg)     Height 06/11/16 2019 5\' 10"  (1.778 m)     Head Circumference --      Peak Flow --      Pain Score 06/11/16 2019 5     Pain Loc --  Pain Edu? --      Excl. in GC? --    No data found.   Updated Vital Signs BP 128/76 (BP Location: Left Arm)   Pulse (!) 103   Temp 99.3 F (37.4 C) (Oral)   Resp 18   Ht 5\' 10"  (1.778 m)   Wt 135 lb (61.2 kg)   SpO2 100%   BMI 19.37 kg/m   Visual Acuity Right Eye Distance:   Left Eye Distance:   Bilateral Distance:    Right Eye Near:   Left Eye Near:    Bilateral Near:     Physical Exam  Constitutional: He is oriented to person, place, and time. He appears well-developed and well-nourished. No distress.  HENT:  Head: Normocephalic and atraumatic.  Cardiovascular: Normal rate, regular rhythm, normal heart sounds and intact distal pulses.   No murmur heard. Pulmonary/Chest: Effort normal and breath sounds normal. No respiratory distress. He has no wheezes. He has no  rales.  Abdominal: Soft. Bowel sounds are normal. He exhibits no distension and no mass. There is tenderness (mild, diffuse; no rebound or guarding). There is no rebound and no guarding.  Neurological: He is alert and oriented to person, place, and time.  Skin: No rash noted. He is not diaphoretic.  Nursing note and vitals reviewed.    UC Treatments / Results  Labs (all labs ordered are listed, but only abnormal results are displayed) Labs Reviewed  CBC WITH DIFFERENTIAL/PLATELET - Abnormal; Notable for the following:       Result Value   Lymphs Abs 0.4 (*)    All other components within normal limits  COMPREHENSIVE METABOLIC PANEL - Abnormal; Notable for the following:    Potassium 3.1 (*)    Glucose, Bld 122 (*)    Calcium 8.7 (*)    ALT 16 (*)    Total Bilirubin 1.3 (*)    All other components within normal limits    EKG  EKG Interpretation None       Radiology No results found.  Procedures Procedures (including critical care time)  Medications Ordered in UC Medications  ondansetron (ZOFRAN-ODT) disintegrating tablet 8 mg (8 mg Oral Given 06/11/16 2037)     Initial Impression / Assessment and Plan / UC Course  I have reviewed the triage vital signs and the nursing notes.  Pertinent labs & imaging results that were available during my care of the patient were reviewed by me and considered in my medical decision making (see chart for details).       Final Clinical Impressions(s) / UC Diagnoses   Final diagnoses:  Nausea and vomiting, intractability of vomiting not specified, unspecified vomiting type    New Prescriptions Discharge Medication List as of 06/11/2016  9:17 PM    START taking these medications   Details  ondansetron (ZOFRAN ODT) 8 MG disintegrating tablet Take 1 tablet (8 mg total) by mouth every 8 (eight) hours as needed., Starting Mon 06/11/2016, Normal       1. Lab results and diagnosis reviewed with patient 2. rx as per orders above;  reviewed possible side effects, interactions, risks and benefits  3. Recommend supportive treatment with clear liquids (gatorade/pedialyte), increased fluids then advance slowly as tolerated 4. Follow-up prn if symptoms worsen or don't improve   Payton Mccallumonty, Nahara Dona, MD 06/11/16 2124

## 2016-11-22 ENCOUNTER — Ambulatory Visit: Payer: Managed Care, Other (non HMO) | Admitting: Nurse Practitioner

## 2016-11-22 ENCOUNTER — Encounter: Payer: Self-pay | Admitting: Nurse Practitioner

## 2016-11-22 VITALS — BP 113/72 | HR 71 | Temp 98.2°F | Ht 70.0 in | Wt 145.0 lb

## 2016-11-22 DIAGNOSIS — M542 Cervicalgia: Secondary | ICD-10-CM

## 2016-11-22 DIAGNOSIS — M25511 Pain in right shoulder: Secondary | ICD-10-CM | POA: Diagnosis not present

## 2016-11-22 MED ORDER — BACLOFEN 10 MG PO TABS: 10.0000 mg | ORAL_TABLET | Freq: Three times a day (TID) | ORAL | 0 refills | Status: DC

## 2016-11-22 NOTE — Patient Instructions (Addendum)
Calvin Castro,  Thank you for coming in to clinic today.  1. Most likely muscle strain is causing your pain.  You still have active muscle spasm. - Start taking Tylenol extra strength 1 to 2 tablets every 6-8 hours for aches or fever/chills for next few days as needed.  Do not take more than 3,000 mg in 24 hours from all medicines.  May take Ibuprofen as well if tolerated 200-400mg  every 8 hours as needed. - Use heat and ice.  Apply this for 15 minutes at a time 6-8 times per day.   - Muscle rub with lidocaine.  Avoid using this with heat and ice to avoid burns. - Let me know if you want to start any physical therapy.  Please schedule a follow-up appointment with [name], AGNP. Return 4-6 weeks if symptoms worsen or fail to improve.  If you have any other questions or concerns, please feel free to call the clinic or send a message through MyChart. You may also schedule an earlier appointment if necessary.  You will receive a survey after today's visit either digitally by e-mail or paper by [employer] mail. Your experiences and feedback matter to us.  Please respond so we know how we are doing as we provide care for you.   Wilhelmina McardleLauren Rockey Guarino, DNP, AGNP-BC Adult Gerontology Nurse Practitioner [facility], [facility]

## 2016-11-22 NOTE — Progress Notes (Signed)
Subjective:    Patient ID: Calvin Castro, male    DOB: 1980-07-05, 36 y.o.   MRN: [id number]  Calvin Castro is a 36 y.o. male presenting on 11/22/2016 for Neck Pain (shoulder pain x 1 week. The pain worsen with movement )   HPI  Neck and Right shoulder pain 1 week ago pt had pain in neck and left shoulder with shooting pains occasionally down both arms,  L fingers (1-5) would also go numb.  Numbness resolved w/ time of a 5-10 minutes.   Today pt has R neck/shoulder pain difficult to turn neck to r.  Yesterday was much improved before worsening again today.  Pain is described as sharp, nagging pain intensity of 4/10 pain at rest.   - Has taken goody powder or aleeve on alternate days w/ some, but not complete relief. - Lidocaine patches w/ some, but not complete relief.   - Has has also used a heat pack one day and a home massager with some relief.  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  He reports holding case/boxes with left hand and rapidly stocking shelf with right hand. (example: yogurt cups). Other contributing factors: - Pt admits he has "lots of smartphone reading time."   - Last couple weeks has been chopping wood (8 lb maul), but none in 5 days.  Social History   Tobacco Use  . Smoking status: Never Smoker  . Smokeless tobacco: Never Used  Substance Use Topics  . Alcohol use: Yes    Alcohol/week: 0.6 - 1.2 oz    Types: 1 - 2 Cans of beer per week  . Drug use: No    Review of Systems Per HPI unless specifically indicated above     Objective:    BP 113/72 (BP Location: Right Arm, Patient Position: Sitting, Cuff Size: Normal)   Pulse 71   Temp 98.2 F (36.8 C) (Oral)   Ht 5\' 10"  (1.778 m)   Wt 145 lb (65.8 kg)   BMI 20.81 kg/m   Wt Readings from Last 3 Encounters:  11/22/16 145 lb (65.8 kg)  06/11/16 135 lb (61.2 kg)  02/17/15 138 lb (62.6 kg)    Physical Exam    General - healthy, well-appearing, NAD HEENT - Normocephalic, atraumatic Neck - supple, non-tender, no LAD Heart - RRR, no murmurs heard Lungs - Clear throughout all lobes, no wheezing, crackles, or rhonchi. Normal work of breathing. C-Spine Inspection: Normal appearance, no spinal deformity, symmetrical. Palpation: No tenderness over spinous processes. Bilateral cervical paraspinal muscles tender and with hypertonicity/spasm.  Bilateral trapezius muscles tender and with hypertonicity/spasm ROM: Full active ROM forward flex / back extension, rotation L/R without discomfort, Lateral bending L/R without discomfort Special Testing: Spurling's test negative for pain, pincer grasp normal strength Strength: Bilateral hip flex/ext 5/5, knee flex/ext 5/5, ankle dorsiflex/plantarflex 5/5 Neurovascular: intact distal sensation to light touch Right Shoulder Inspection: Normal appearance bilateral symmetrical Palpation: Non-tender to palpation over anterior, lateral, or posterior shoulder ROM: Full intact active ROM forward flexion, abduction, internal / external rotation, symmetrical Special Testing: Rotator cuff testing negative for weakness with supraspinatus full can and empty can test, O'brien's negative for labral pain, Hawkin's AC impingement negative for pain Strength: Normal strength 5/5 flex/ext, ext rot / int rot, grip, rotator cuff str testing. Neurovascular: Distally intact pulses, sensation to light touch Extremeties - non-tender, no edema, cap refill < 2 seconds,  peripheral pulses intact +2 bilaterally Skin - warm, dry Neuro - awake, alert, oriented x3, normal gait Psych - Normal mood and affect, normal behavior   Results for orders placed or performed during the hospital encounter of 06/11/16  CBC with Differential  Result Value Ref Range   WBC 6.8 3.8 - 10.6 K/uL   RBC 5.63 4.40 - 5.90 MIL/uL   Hemoglobin 16.3 13.0 - 18.0 g/dL   HCT 16.148.0 09.640.0 - 04.552.0 %   MCV 85.3 80.0 - 100.0 fL    MCH 29.0 26.0 - 34.0 pg   MCHC 34.0 32.0 - 36.0 g/dL   RDW 40.913.1 81.111.5 - 91.414.5 %   Platelets 183 150 - 440 K/uL   Neutrophils Relative % 86 %   Neutro Abs 5.8 1.4 - 6.5 K/uL   Lymphocytes Relative 6 %   Lymphs Abs 0.4 (L) 1.0 - 3.6 K/uL   Monocytes Relative 8 %   Monocytes Absolute 0.6 0.2 - 1.0 K/uL   Eosinophils Relative 0 %   Eosinophils Absolute 0.0 0 - 0.7 K/uL   Basophils Relative 0 %   Basophils Absolute 0.0 0 - 0.1 K/uL  Comprehensive metabolic panel  Result Value Ref Range   Sodium 136 135 - 145 mmol/L   Potassium 3.1 (L) 3.5 - 5.1 mmol/L   Chloride 102 101 - 111 mmol/L   CO2 25 22 - 32 mmol/L   Glucose, Bld 122 (H) 65 - 99 mg/dL   BUN 15 6 - 20 mg/dL   Creatinine, Ser 7.820.96 0.61 - 1.24 mg/dL   Calcium 8.7 (L) 8.9 - 10.3 mg/dL   Total Protein 7.8 6.5 - 8.1 g/dL   Albumin 4.6 3.5 - 5.0 g/dL   AST 24 15 - 41 U/L   ALT 16 (L) 17 - 63 U/L   Alkaline Phosphatase 61 38 - 126 U/L   Total Bilirubin 1.3 (H) 0.3 - 1.2 mg/dL   GFR calc non Af Amer >60 >60 mL/min   GFR calc Af Amer >60 >60 mL/min   Anion gap 9 5 - 15      Assessment & Plan:   Problem List Items Addressed This Visit    None    Visit Diagnoses    Acute pain of right shoulder    -  Primary   Neck pain, acute     Pain likely self-limited.  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  Conservative therapies not yet tried.  Plan:  1. Treat with NSAIDs (acetaminophen and ibuprofen).  Discussed alternate dosing and max dosing. 2. Apply heat and/or ice to affected area. 3. May also apply a muscle rub with lidocaine after heat or ice. 4. Take muscle relaxer baclofen 10 mg up to three times daily.  Cautioned drowsiness. 5. Pt offered PT/OT and has declined at this time.  Can consider imaging if pain persists w/ adequate conservative therapy. 6. Follow up 4-6 weeks if no improvement.    Relevant Medications   baclofen (LIORESAL) 10 MG tablet      Meds ordered this  encounter  Medications  . baclofen (LIORESAL) 10 MG tablet    Sig: Take 1 tablet (10 mg total) 3 (three) times daily by mouth.    Dispense:  30 each    Refill:  0    Order Specific Question:   Supervising Provider    Answer:   Smitty CordsKARAMALEGOS, ALEXANDER J [[id number]]    Follow up plan: Return 4-6 weeks if symptoms worsen  or fail to improve.  Wilhelmina McardleLauren Whitnee Orzel, DNP, AGPCNP-BC Adult Gerontology Primary Care Nurse Practitioner [facility] 12/04/2016, 2:33 PM

## 2016-12-04 ENCOUNTER — Encounter: Payer: Self-pay | Admitting: Nurse Practitioner

## 2017-02-12 ENCOUNTER — Emergency Department
Admission: EM | Admit: 2017-02-12 | Discharge: 2017-02-12 | Disposition: A | Discharge status: Home / Self Care | Payer: Managed Care, Other (non HMO) | Attending: Emergency Medicine | Admitting: Emergency Medicine

## 2017-02-12 ENCOUNTER — Other Ambulatory Visit: Payer: Self-pay

## 2017-02-12 ENCOUNTER — Encounter: Payer: Self-pay | Admitting: Emergency Medicine

## 2017-02-12 DIAGNOSIS — Z79899 Other long term (current) drug therapy: Secondary | ICD-10-CM | POA: Insufficient documentation

## 2017-02-12 DIAGNOSIS — R1030 Lower abdominal pain, unspecified: Secondary | ICD-10-CM | POA: Diagnosis present

## 2017-02-12 LAB — COMPREHENSIVE METABOLIC PANEL
ALBUMIN: 4.7 g/dL (ref 3.5–5.0)
ALT: 14 U/L — ABNORMAL LOW (ref 17–63)
AST: 20 U/L (ref 15–41)
Alkaline Phosphatase: 57 U/L (ref 38–126)
Anion gap: 7 (ref 5–15)
BUN: 18 mg/dL (ref 6–20)
CO2: 27 mmol/L (ref 22–32)
Calcium: 9.1 mg/dL (ref 8.9–10.3)
Chloride: 102 mmol/L (ref 101–111)
Creatinine, Ser: 1.09 mg/dL (ref 0.61–1.24)
GFR calc Af Amer: >60 mL/min (ref >60)
GFR calc non Af Amer: >60 mL/min (ref >60)
GLUCOSE: 111 mg/dL — AB (ref 65–99)
POTASSIUM: 3.6 mmol/L (ref 3.5–5.1)
SODIUM: 136 mmol/L (ref 135–145)
Total Bilirubin: 1.3 mg/dL — ABNORMAL HIGH (ref 0.3–1.2)
Total Protein: 7.7 g/dL (ref 6.5–8.1)

## 2017-02-12 LAB — URINALYSIS, COMPLETE (UACMP) WITH MICROSCOPIC
BILIRUBIN URINE: NEGATIVE
Bacteria, UA: NONE SEEN
Glucose, UA: NEGATIVE mg/dL
HGB URINE DIPSTICK: NEGATIVE
Ketones, ur: NEGATIVE mg/dL
LEUKOCYTES UA: NEGATIVE
NITRITE: NEGATIVE
Protein, ur: NEGATIVE mg/dL
Specific Gravity, Urine: 1.026 (ref 1.005–1.030)
pH: 6 (ref 5.0–8.0)

## 2017-02-12 LAB — LIPASE, BLOOD: Lipase: 36 U/L (ref 11–51)

## 2017-02-12 LAB — CBC
HEMATOCRIT: 45.4 % (ref 40.0–52.0)
HEMOGLOBIN: 15.5 g/dL (ref 13.0–18.0)
MCH: 29.7 pg (ref 26.0–34.0)
MCHC: 34.1 g/dL (ref 32.0–36.0)
MCV: 87.1 fL (ref 80.0–100.0)
Platelets: 279 10*3/uL (ref 150–440)
RBC: 5.21 MIL/uL (ref 4.40–5.90)
RDW: 12.8 % (ref 11.5–14.5)
WBC: 7.9 10*3/uL (ref 3.8–10.6)

## 2017-02-12 MED ORDER — TRAMADOL HCL 50 MG PO TABS: 50.0000 mg | ORAL_TABLET | Freq: Four times a day (QID) | ORAL | 0 refills | Status: AC | PRN

## 2017-02-12 NOTE — ED Notes (Signed)
Pt c/o lower abd pain with intermittent loose stools for the past 3 weeks. States he has been having problems off and on for a while and just wants to feel better.

## 2017-02-12 NOTE — ED Notes (Signed)
FN: pt presents with abd pain, gas and cramps for a couple of weeks.

## 2017-02-12 NOTE — ED Provider Notes (Signed)
[facility] Emergency Department Provider Note   ____________________________________________    I have reviewed the triage vital signs and the nursing notes.   HISTORY  Chief Complaint Abdominal Pain     HPI Calvin Castro is a 37 y.o. male who presents with complaints of lower abdominal pain.  Patient reports he intermittently has pain in his lower abdomen that seems to radiate to his rectum.  There is not seem to be a pattern of occurrence of this.  Today it is happened this morning and seemed to be more severe than usual so he came to the emergency department.  He does report that he has been diagnosed with IBS in the past and is apparently also had a colonoscopy related to this pain which was found to be normal but that was several years ago.  He denies fevers or chills.  No nausea or vomiting.  Currently feels well and has no complaints at all.  Has tried Bentyl in the past without relief   Past Medical History:  Diagnosis Date  . Hemorrhoids   . History of IBS   . Hx of diarrhea   . Rectal bleeding   . Seasonal allergies     Patient Active Problem List   Diagnosis Date Noted  . Right flank pain, chronic 02/17/2015  . Lower abdominal pain 02/17/2015  . IBS (irritable bowel syndrome) 02/17/2015    Past Surgical History:  Procedure Laterality Date  . COLONOSCOPY N/A 05/24/2014   Procedure: COLONOSCOPY;  Surgeon: Midge Minium, MD;  Location: [facility];  Service: Gastroenterology;  Laterality: N/A;  cecum time- 1113    Prior to Admission medications   Medication Sig Start Date End Date Taking? Authorizing Provider  baclofen (LIORESAL) 10 MG tablet Take 1 tablet (10 mg total) 3 (three) times daily by mouth. 11/22/16   Galen Manila, NP  traMADol (ULTRAM) 50 MG tablet Take 1 tablet (50 mg total) by mouth every 6 (six) hours as needed. 02/12/17 02/12/18  Jene Every, MD     Allergies Patient has no known allergies.  No  family history on file.  Social History Social History   Tobacco Use  . Smoking status: Never Smoker  . Smokeless tobacco: Never Used  Substance Use Topics  . Alcohol use: Yes    Alcohol/week: 0.6 - 1.2 oz    Types: 1 - 2 Cans of beer per week  . Drug use: No    Review of Systems  Constitutional: No fever/chills Eyes: No visual changes.  ENT: No sore throat. Cardiovascular: Denies chest pain. Respiratory: Denies shortness of breath. Gastrointestinal:  No nausea, no vomiting.   Genitourinary: Negative for dysuria. Musculoskeletal: Negative for back pain. Skin: Negative for rash. Neurological: Negative for headaches or weakness   ____________________________________________   PHYSICAL EXAM:  VITAL SIGNS: ED Triage Vitals  Enc Vitals Group     BP 02/12/17 1245 135/85     Pulse Rate 02/12/17 1245 75     Resp 02/12/17 1245 20     Temp 02/12/17 1245 98.8 F (37.1 C)     Temp Source 02/12/17 1245 Oral     SpO2 02/12/17 1245 97 %     Weight 02/12/17 1246 63.5 kg (140 lb)     Height 02/12/17 1246 1.778 m (5\' 10" )     Head Circumference --      Peak Flow --      Pain Score 02/12/17 1252 7     Pain Loc --  Pain Edu? --      Excl. in GC? --     Constitutional: Alert and oriented. No acute distress. Pleasant and interactive Eyes: Conjunctivae are normal.   Nose: No congestion/rhinnorhea. Mouth/Throat: Mucous membranes are moist.    Cardiovascular: Normal rate, regular rhythm.  Good peripheral circulation. Respiratory: Normal respiratory effort.  No retractions. Gastrointestinal: Soft and nontender. No distention.  No CVA tenderness. Genitourinary: deferred Musculoskeletal:  Warm and well perfused Neurologic:  Normal speech and language. No gross focal neurologic deficits are appreciated.  Skin:  Skin is warm, dry and intact. No rash noted. Psychiatric: Mood and affect are normal. Speech and behavior are  normal.  ____________________________________________   LABS (all labs ordered are listed, but only abnormal results are displayed)  Labs Reviewed  COMPREHENSIVE METABOLIC PANEL - Abnormal; Notable for the following components:      Result Value   Glucose, Bld 111 (*)    ALT 14 (*)    Total Bilirubin 1.3 (*)    All other components within normal limits  URINALYSIS, COMPLETE (UACMP) WITH MICROSCOPIC - Abnormal; Notable for the following components:   Color, Urine YELLOW (*)    APPearance HAZY (*)    Squamous Epithelial / LPF 0-5 (*)    All other components within normal limits  LIPASE, BLOOD  CBC   ____________________________________________  EKG   ____________________________________________  RADIOLOGY  None ____________________________________________   PROCEDURES  Procedure(s) performed: No  Procedures   Critical Care performed: No ____________________________________________   INITIAL IMPRESSION / ASSESSMENT AND PLAN / ED COURSE  Pertinent labs & imaging results that were available during my care of the patient were reviewed by me and considered in my medical decision making (see chart for details).  Patient well-appearing with a normal exam.  He is asymptomatic currently.  Lab work is unremarkable.  No tenderness palpation on exam, no indication for imaging at this time.  Recommend follow-up with GI for further workup for ongoing chronic issue   ____________________________________________   FINAL CLINICAL IMPRESSION(S) / ED DIAGNOSES  Final diagnoses:  Lower abdominal pain        Note:  This document was prepared using Dragon voice recognition software and may include unintentional dictation errors.    Jene EveryKinner, Ronin Crager, MD 02/12/17 [id number]

## 2017-02-12 NOTE — ED Triage Notes (Signed)
C/O intermittent lower abdominal pain x 3 weeks.  Denies dysuria.  Last BM 02/11/17

## 2018-09-03 ENCOUNTER — Other Ambulatory Visit: Payer: Self-pay

## 2018-09-03 ENCOUNTER — Telehealth: Payer: Self-pay

## 2018-09-03 DIAGNOSIS — Z20822 Contact with and (suspected) exposure to covid-19: Secondary | ICD-10-CM

## 2018-09-03 NOTE — Telephone Encounter (Signed)
After hours advised him to call us this morning. He said he has sore throat and weakness and [name] advised he needs to go get tested. He is refusing to go get tested and argued about having [place] call back. He wants to be seen or get treated by [place] but may have symptoms of Covid according to front desk. pls advise.

## 2018-09-03 NOTE — Telephone Encounter (Signed)
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 He's concern because he's feeling fatigue, sore throat, and possible fever. I advised the patient to go to the [employer] for [employer]. All the necessary instructions was given and he verbalize understanding.

## 2018-09-04 LAB — NOVEL CORONAVIRUS, NAA: SARS-CoV-2, NAA: NOT DETECTED

## 2019-11-30 ENCOUNTER — Other Ambulatory Visit: Payer: Self-pay | Admitting: Family Medicine

## 2019-11-30 DIAGNOSIS — R1032 Left lower quadrant pain: Secondary | ICD-10-CM

## 2019-12-02 ENCOUNTER — Ambulatory Visit: Payer: Managed Care, Other (non HMO)

## 2019-12-03 ENCOUNTER — Other Ambulatory Visit: Payer: Self-pay

## 2019-12-03 ENCOUNTER — Ambulatory Visit
Admission: RE | Admit: 2019-12-03 | Discharge: 2019-12-03 | Disposition: A | Discharge status: Home / Self Care | Payer: Managed Care, Other (non HMO) | Source: Ambulatory Visit | Attending: Family Medicine | Admitting: Family Medicine

## 2019-12-03 DIAGNOSIS — R1032 Left lower quadrant pain: Secondary | ICD-10-CM | POA: Diagnosis present

## 2022-02-18 IMAGING — US US PELVIS LIMITED
1 series · 14 of 16 positions shown · non-contrast
Comparison: CT Abdomen and Pelvis 02/20/2013.

CLINICAL DATA: 38-year-old male with left inguinal pain and
pressure for 1 week. Query hernia.

EXAM:
LIMITED ULTRASOUND OF PELVIS
TECHNIQUE: Limited transabdominal ultrasound examination of the pelvis was
performed.

[Series 1: us pelvis limited · 0.08mm/px · 16 acquisitions, 14 frames shown]
[im 1/16]
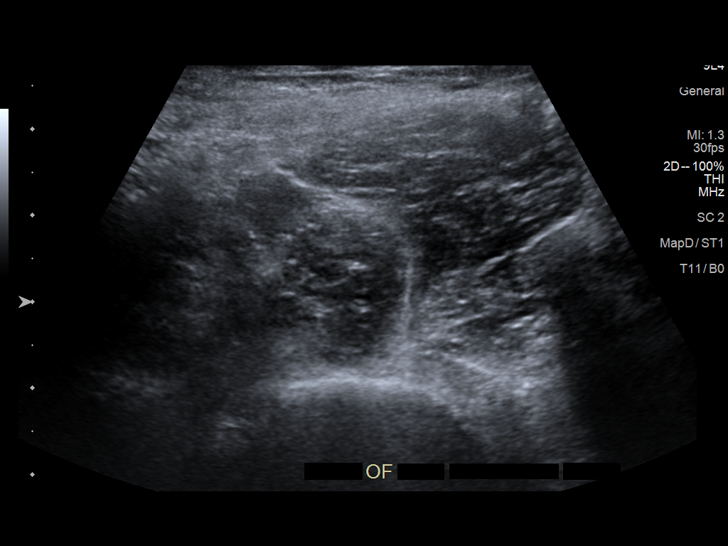
[im 2/16]
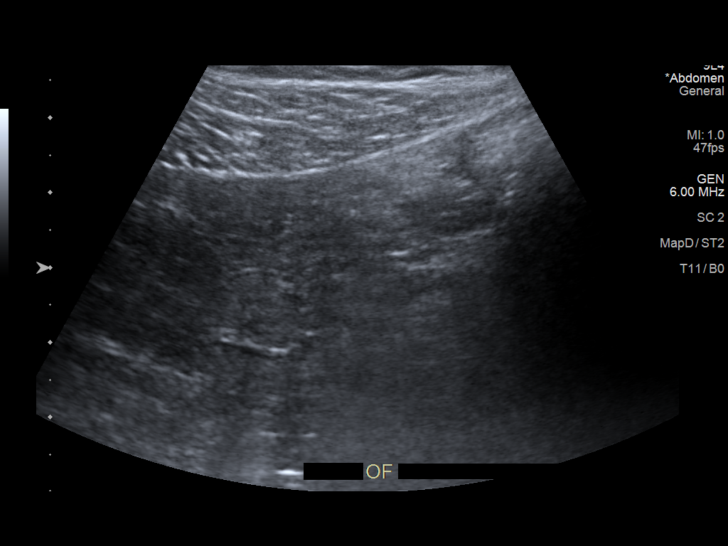
[im 3/16]
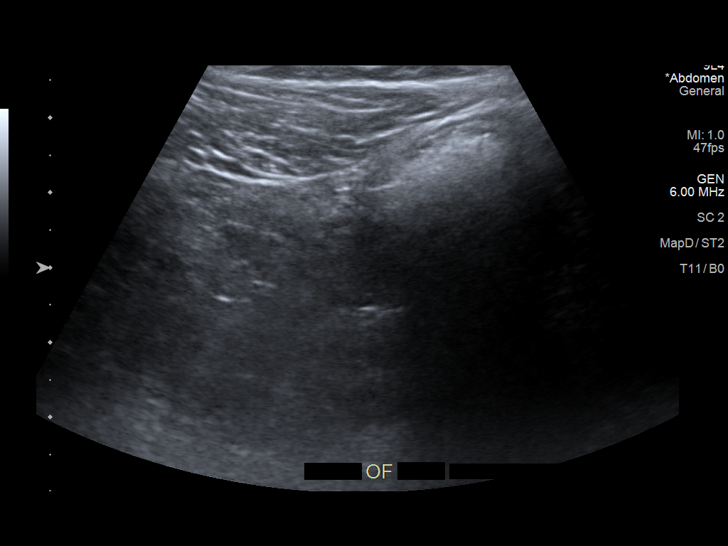
[im 5/16]
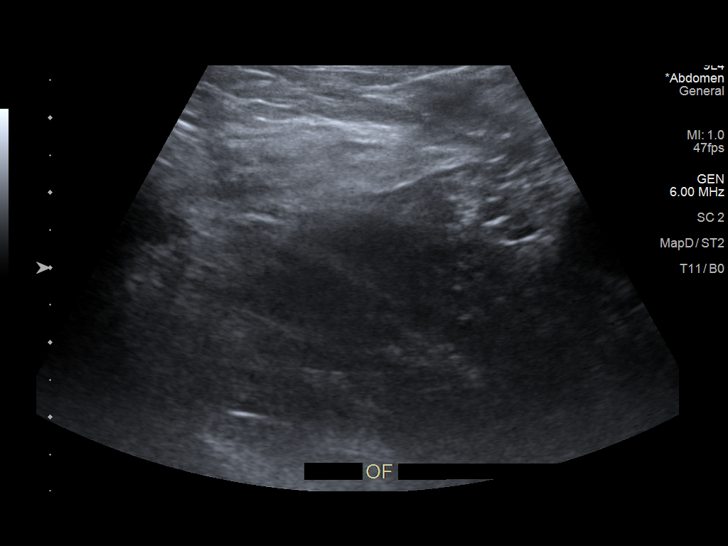
[im 6/16]
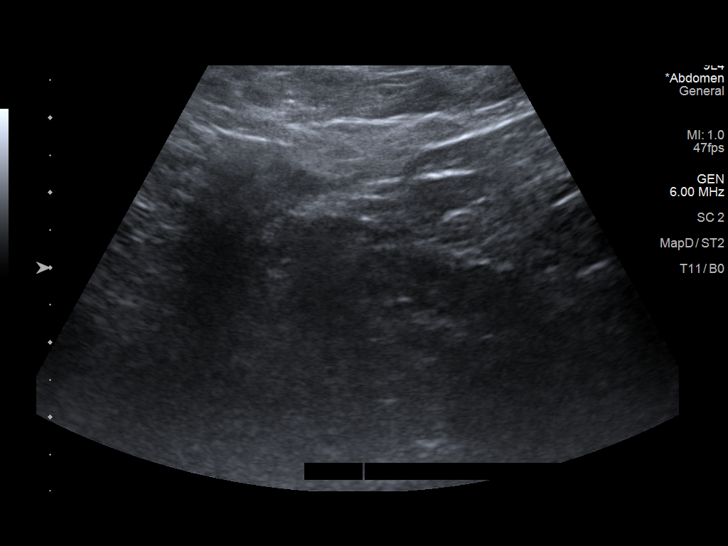
[im 7/16]
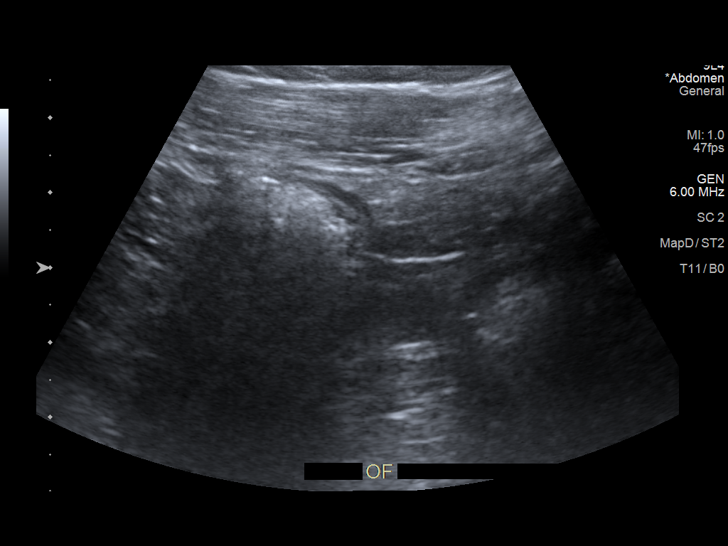
[im 8/16]
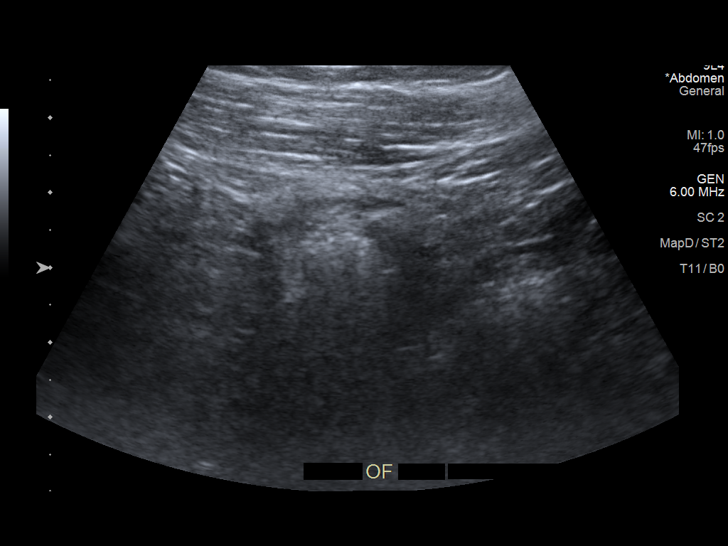
[im 9/16]
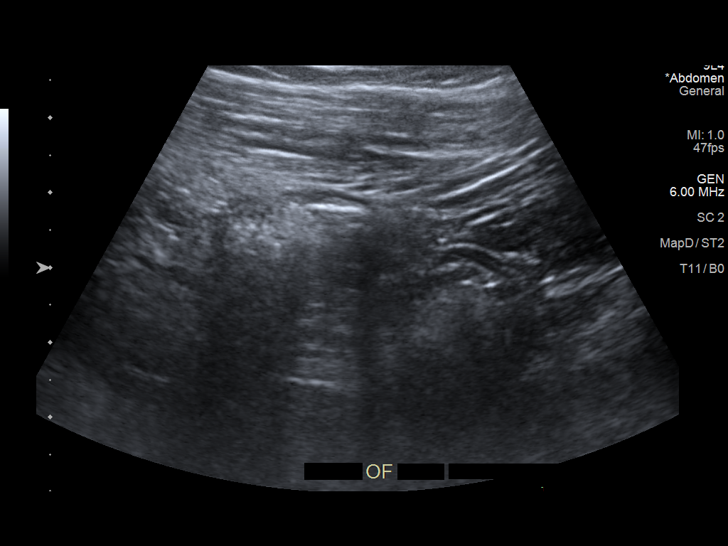
[im 10/16]
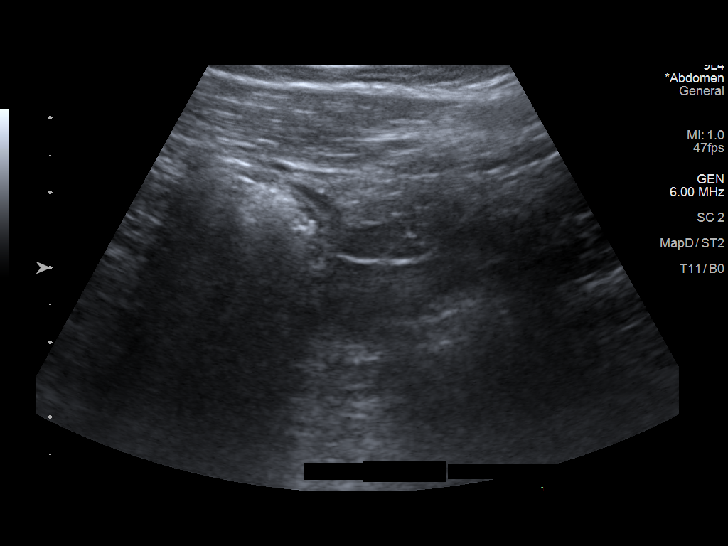
[im 11/16]
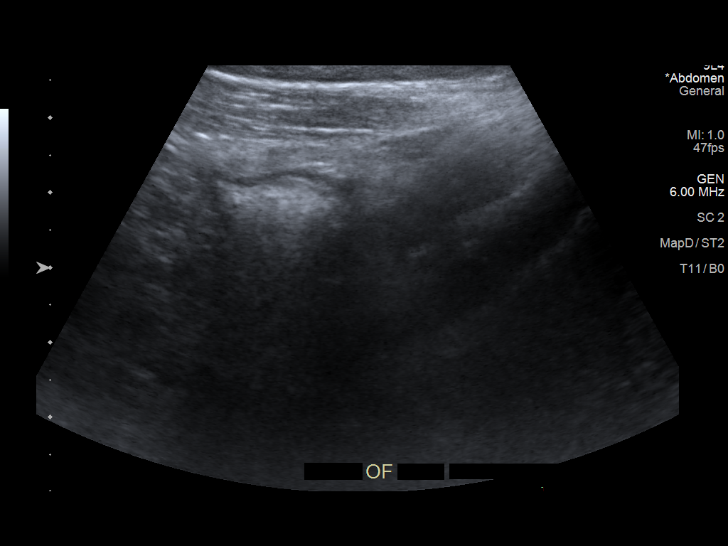
[im 13/16]
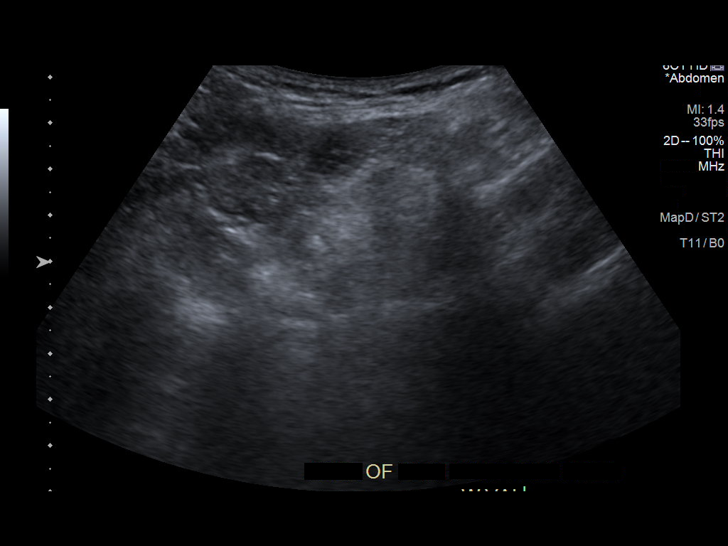
[im 14/16]
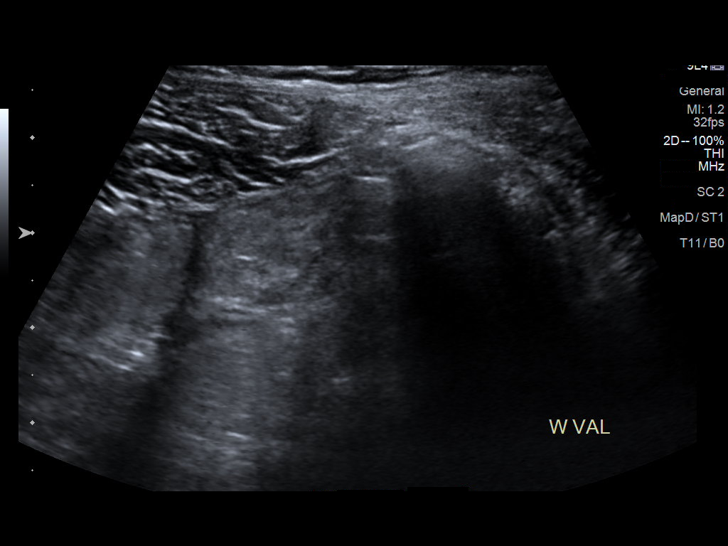
[im 15/16]
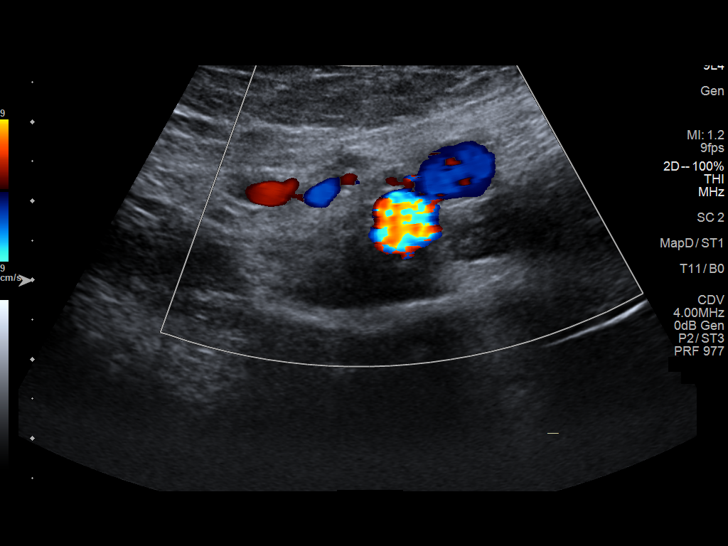
[im 16/16]
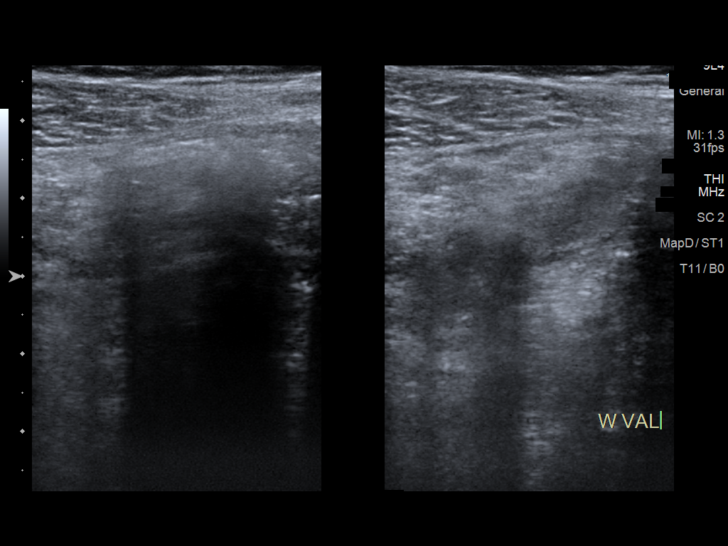

[14 of 16 positions shown; findings below may reference images not displayed]

FINDINGS: The 0327 CT was negative for inguinal hernia. Grayscale and brief
color Doppler images today in the left groin area of pain and
palpable concern demonstrate normal fibromuscular architecture. No
mass or hernia is identified. No abnormality in the region becomes
evident with Valsalva.
IMPRESSION: No ultrasound abnormality in the area of clinical concern.

If there ongoing high suspicion of hernia then noncontrast Pelvis CT
would be most sensitive, and could be compared to that on
02/20/2013.

## 2022-03-30 DIAGNOSIS — L02415 Cutaneous abscess of right lower limb: Secondary | ICD-10-CM | POA: Diagnosis not present

## 2022-05-30 DIAGNOSIS — J309 Allergic rhinitis, unspecified: Secondary | ICD-10-CM | POA: Diagnosis not present

## 2022-06-12 ENCOUNTER — Encounter: Payer: Self-pay | Admitting: Nurse Practitioner

## 2022-06-12 ENCOUNTER — Other Ambulatory Visit (HOSPITAL_COMMUNITY)
Admission: RE | Admit: 2022-06-12 | Discharge: 2022-06-12 | Disposition: A | Discharge status: Home / Self Care | Payer: BC Managed Care – PPO | Source: Ambulatory Visit | Attending: Nurse Practitioner | Admitting: Nurse Practitioner

## 2022-06-12 ENCOUNTER — Ambulatory Visit: Payer: BC Managed Care – PPO | Admitting: Nurse Practitioner

## 2022-06-12 VITALS — BP 100/80 | HR 66 | Temp 98.6°F | Ht 69.0 in | Wt 160.4 lb

## 2022-06-12 DIAGNOSIS — F339 Major depressive disorder, recurrent, unspecified: Secondary | ICD-10-CM

## 2022-06-12 DIAGNOSIS — Z113 Encounter for screening for infections with a predominantly sexual mode of transmission: Secondary | ICD-10-CM | POA: Diagnosis not present

## 2022-06-12 DIAGNOSIS — F411 Generalized anxiety disorder: Secondary | ICD-10-CM | POA: Diagnosis not present

## 2022-06-12 MED ORDER — ESCITALOPRAM OXALATE 20 MG PO TABS
20.0000 mg | ORAL_TABLET | Freq: Every day | ORAL | 3 refills | Status: AC
Start: 2022-06-12 — End: ?

## 2022-06-12 NOTE — Progress Notes (Unsigned)
Bethanie Dicker, NP-C Phone: [phone]  Calvin Castro is a 42 y.o. male who presents today to establish care.   Sexually Transmitted Disease Check: Patient presents for sexually transmitted disease check. Sexual history reviewed with the patient. STD exposure: {std exposure:14027}.  Previous history of STD:  {stds:14358}. Current symptoms include {std sx :14029}.  Contraception: {contraceptive method:5051}.   Anxiety/Depression-   Active Ambulatory Problems    Diagnosis Date Noted   Right flank pain, chronic 02/17/2015   Lower abdominal pain 02/17/2015   IBS (irritable bowel syndrome) 02/17/2015   Generalized anxiety disorder 06/12/2022   Depression, recurrent (HCC) 06/12/2022   Screening for STDs (sexually transmitted diseases) 06/12/2022   Resolved Ambulatory Problems    Diagnosis Date Noted   No Resolved Ambulatory Problems   Past Medical History:  Diagnosis Date   Hemorrhoids    History of IBS    Hx of diarrhea    Rectal bleeding    Seasonal allergies     Family History  Problem Relation Age of Onset   Alcohol abuse Mother    Hypertension Father    Hyperlipidemia Father    Hearing loss Father    Arthritis Father    Mental illness Father     Social History   Socioeconomic History   Marital status: Married    Spouse name: Not on file   Number of children: Not on file   Years of education: Not on file   Highest education level: Not on file  Occupational History   Not on file  Tobacco Use   Smoking status: Never   Smokeless tobacco: Never  Substance and Sexual Activity   Alcohol use: Yes    Alcohol/week: 1.0 - 2.0 standard drink of alcohol    Types: 1 - 2 Cans of beer per week   Drug use: No   Sexual activity: Not on file  Other Topics Concern   Not on file  Social History Narrative   Not on file   Social Determinants of Health   Financial Resource Strain: Not on file  Food Insecurity: Not on file  Transportation Needs: Not on file  Physical  Activity: Not on file  Stress: Not on file  Social Connections: Not on file  Intimate Partner Violence: Not on file    ROS  General:  Negative for unexplained weight loss, fever Skin: Negative for new or changing mole, sore that won't heal HEENT: Negative for trouble hearing, trouble seeing, ringing in ears, mouth sores, hoarseness, change in voice, dysphagia. CV:  Negative for chest pain, dyspnea, edema, palpitations Resp: Negative for cough, dyspnea, hemoptysis GI: Negative for nausea, vomiting, diarrhea, constipation, abdominal pain, melena, hematochezia. GU: Negative for dysuria, incontinence, urinary hesitance, hematuria, vaginal or penile discharge, polyuria, sexual difficulty, lumps in testicle or breasts MSK: Negative for muscle cramps or aches, joint pain or swelling Neuro: Negative for headaches, weakness, numbness, dizziness, passing out/fainting Psych: Negative for memory problems  Objective  Physical Exam Vitals:   06/12/22 1013  BP: 100/80  Pulse: 66  Temp: 98.6 F (37 C)  SpO2: 97%    BP Readings from Last 3 Encounters:  06/12/22 100/80  02/12/17 121/78  11/22/16 113/72   Wt Readings from Last 3 Encounters:  06/12/22 160 lb 6.4 oz (72.8 kg)  02/12/17 140 lb (63.5 kg)  11/22/16 145 lb (65.8 kg)    Physical Exam Constitutional:      General: He is not in acute distress.    Appearance: Normal appearance.  HENT:  Head: Normocephalic.  Cardiovascular:     Rate and Rhythm: Normal rate and regular rhythm.     Heart sounds: Normal heart sounds.  Pulmonary:     Effort: Pulmonary effort is normal.     Breath sounds: Normal breath sounds.  Skin:    General: Skin is warm and dry.  Neurological:     General: No focal deficit present.     Mental Status: He is alert.  Psychiatric:        Mood and Affect: Mood normal.        Behavior: Behavior normal.    Assessment/Plan:   Generalized anxiety disorder -     Escitalopram Oxalate; Take 1 tablet (20  mg total) by mouth daily.  Dispense: 90 tablet; Refill: 3 -     Ambulatory referral to Psychiatry  Depression, recurrent (HCC) -     Escitalopram Oxalate; Take 1 tablet (20 mg total) by mouth daily.  Dispense: 90 tablet; Refill: 3 -     Ambulatory referral to Psychiatry  Screening for STDs (sexually transmitted diseases) -     Urine cytology ancillary only -     RPR -     HIV Antibody (routine testing w rflx) -     HSV(herpes simplex vrs) 1+2 ab-IgG   Return in about 4 weeks (around 07/10/2022) for Anxiety/Depression.   Bethanie Dicker, NP-C [facility] - [employer]

## 2022-06-13 LAB — URINE CYTOLOGY ANCILLARY ONLY
Chlamydia: NEGATIVE
Comment: NEGATIVE
Comment: NEGATIVE
Comment: NORMAL
Neisseria Gonorrhea: NEGATIVE
Trichomonas: NEGATIVE

## 2022-06-13 LAB — HIV ANTIBODY (ROUTINE TESTING W REFLEX): HIV 1&2 Ab, 4th Generation: NONREACTIVE

## 2022-06-13 LAB — RPR: RPR Ser Ql: NONREACTIVE

## 2022-06-13 LAB — HSV(HERPES SIMPLEX VRS) I + II AB-IGG
HAV 1 IGG,TYPE SPECIFIC AB: 7.92 index — ABNORMAL HIGH
HSV 2 IGG,TYPE SPECIFIC AB: <0.9 index

## 2022-06-14 ENCOUNTER — Encounter: Payer: Self-pay | Admitting: Nurse Practitioner

## 2022-06-14 NOTE — Assessment & Plan Note (Signed)
Chronic. Worsening symptoms recently due to increased life stressors. GAD- 16. Will increase Lexapro to 20 mg daily. Counseled patient on common side effects. Encouraged to contact if worsening symptoms, unusual behavior changes or suicidal thoughts occur. Referral placed to Psychiatry. Will continue to monitor.

## 2022-06-14 NOTE — Assessment & Plan Note (Signed)
No known exposure. Denies any symptoms. Has had multiple new partners in the last year. Lab work as outlined. Will contact patient with results. Counseled on safe sex practices.

## 2022-06-14 NOTE — Assessment & Plan Note (Signed)
Chronic. Worsening symptoms recently due to increased life stressors. PHQ- 19. Denies SI/HI. Will increase Lexapro to 20 mg daily. Counseled patient on common side effects. Encouraged to contact if worsening symptoms, unusual behavior changes or suicidal thoughts occur. Referral placed to Psychiatry. Will continue to monitor.

## 2022-07-06 ENCOUNTER — Encounter: Payer: Self-pay | Admitting: Nurse Practitioner

## 2022-07-06 ENCOUNTER — Ambulatory Visit (INDEPENDENT_AMBULATORY_CARE_PROVIDER_SITE_OTHER): Payer: BC Managed Care – PPO | Admitting: Nurse Practitioner

## 2022-07-06 VITALS — BP 120/78 | HR 71 | Temp 98.1°F | Ht 69.0 in | Wt 164.0 lb

## 2022-07-06 DIAGNOSIS — F411 Generalized anxiety disorder: Secondary | ICD-10-CM

## 2022-07-06 DIAGNOSIS — F339 Major depressive disorder, recurrent, unspecified: Secondary | ICD-10-CM | POA: Diagnosis not present

## 2022-07-06 DIAGNOSIS — L239 Allergic contact dermatitis, unspecified cause: Secondary | ICD-10-CM | POA: Diagnosis not present

## 2022-07-06 NOTE — Progress Notes (Unsigned)
Bethanie Dicker, NP-C Phone: [phone]  Calvin Castro is a 42 y.o. male who presents today for follow up.   Patient currently taking Lexapro 20 mg daily. His dose was increased from 10 mg to 20 mg daily last month. He does believe the increased dose is helping and working better than the 10 mg. He has had decreased life stressors recently. PHQ- 9 and GAD- 10 today. Denies SI/HI. He has not heard from Psychiatry to establish care.   He has noticed a rash occurring on his bilateral arms, back and hips for the last several weeks. It has been gradually improving. He describes it as small red bumps some with white heads. It does not itch. It is not painful. He did recently change his laundry detergent and soap, he believes this could be the cause of the rash as it is only occurring where his clothes are in close contact with his skin. He has not taken any medications for the rash.   Social History   Tobacco Use  Smoking Status Never  Smokeless Tobacco Never    Current Outpatient Medications on File Prior to Visit  Medication Sig Dispense Refill   escitalopram (LEXAPRO) 20 MG tablet Take 1 tablet (20 mg total) by mouth daily. 90 tablet 3   No current facility-administered medications on file prior to visit.    ROS see history of present illness  Objective  Physical Exam Vitals:   07/06/22 0800  BP: 120/78  Pulse: 71  Temp: 98.1 F (36.7 C)  SpO2: 97%    BP Readings from Last 3 Encounters:  07/06/22 120/78  06/12/22 100/80  02/12/17 121/78   Wt Readings from Last 3 Encounters:  07/06/22 164 lb (74.4 kg)  06/12/22 160 lb 6.4 oz (72.8 kg)  02/12/17 140 lb (63.5 kg)    Physical Exam Constitutional:      General: He is not in acute distress.    Appearance: Normal appearance.  HENT:     Head: Normocephalic.  Cardiovascular:     Rate and Rhythm: Normal rate and regular rhythm.     Heart sounds: Normal heart sounds.  Pulmonary:     Effort: Pulmonary effort is normal.      Breath sounds: Normal breath sounds.  Skin:    General: Skin is warm and dry.     Findings: Rash present. Rash is papular.     Comments: Small, erythematous papules noted on bilateral arms and across back  Neurological:     General: No focal deficit present.     Mental Status: He is alert.  Psychiatric:        Mood and Affect: Mood normal.        Behavior: Behavior normal.    Assessment/Plan: Please see individual problem list.  Allergic contact dermatitis, unspecified trigger Assessment & Plan: Improving. Likely due to recent change in laundry detergent and soap. Advised to start Zyrtec 10 mg daily and discontinue use of current laundry detergent and soap. He will contact if his symptoms do not resolve or are worsening.    Depression, recurrent (HCC) Assessment & Plan: Chronic. Stable on Lexapro 20 mg daily. Continue. Improvement in symptoms with increased dose. PHQ- 9 today. Denies SI/HI. Encouraged to contact if worsening symptoms, unusual behavior changes or suicidal thoughts occur. Will reach out to referral coordinator regarding status of referral to Psychiatry. Will continue to monitor.    Generalized anxiety disorder Assessment & Plan: Chronic. Stable on Lexapro 20 mg daily. Continue. Improvement in symptoms with  increased dose. GAD- 10 today. Will continue to monitor.    Return in about 6 months (around 01/05/2023) for Follow up.   Bethanie Dicker, NP-C [facility] - [employer]

## 2022-07-12 DIAGNOSIS — L239 Allergic contact dermatitis, unspecified cause: Secondary | ICD-10-CM | POA: Insufficient documentation

## 2022-07-12 NOTE — Assessment & Plan Note (Signed)
Improving. Likely due to recent change in laundry detergent and soap. Advised to start Zyrtec 10 mg daily and discontinue use of current laundry detergent and soap. He will contact if his symptoms do not resolve or are worsening.

## 2022-07-12 NOTE — Assessment & Plan Note (Signed)
Chronic. Stable on Lexapro 20 mg daily. Continue. Improvement in symptoms with increased dose. GAD- 10 today. Will continue to monitor.

## 2022-07-12 NOTE — Assessment & Plan Note (Addendum)
Chronic. Stable on Lexapro 20 mg daily. Continue. Improvement in symptoms with increased dose. PHQ- 9 today. Denies SI/HI. Encouraged to contact if worsening symptoms, unusual behavior changes or suicidal thoughts occur. Will reach out to referral coordinator regarding status of referral to Psychiatry. Will continue to monitor.

## 2022-07-30 ENCOUNTER — Telehealth: Payer: Self-pay | Admitting: Nurse Practitioner

## 2022-07-30 NOTE — Telephone Encounter (Signed)
Patient called and said last month Calvin Castro saw him for bumps on his  upper arms and back. They have not gone away and would like some medication or soap for the bumps.

## 2022-07-31 ENCOUNTER — Other Ambulatory Visit: Payer: Self-pay | Admitting: Nurse Practitioner

## 2022-07-31 DIAGNOSIS — L239 Allergic contact dermatitis, unspecified cause: Secondary | ICD-10-CM

## 2022-07-31 MED ORDER — CETIRIZINE HCL 10 MG PO TABS
10.0000 mg | ORAL_TABLET | Freq: Every day | ORAL | 11 refills | Status: AC
Start: 2022-07-31 — End: ?

## 2022-07-31 MED ORDER — METHYLPREDNISOLONE 4 MG PO TBPK
ORAL_TABLET | ORAL | 0 refills | Status: AC
Start: 2022-07-31 — End: ?

## 2022-07-31 NOTE — Telephone Encounter (Signed)
Pt has been informed and states he has changed laundry detergent again. He states it doesn't itch just has the bumps that's now starting to go down his right arm more

## 2022-12-04 DIAGNOSIS — J069 Acute upper respiratory infection, unspecified: Secondary | ICD-10-CM | POA: Diagnosis not present

## 2022-12-19 ENCOUNTER — Encounter: Payer: Self-pay | Admitting: Emergency Medicine

## 2022-12-19 ENCOUNTER — Emergency Department
Admission: EM | Admit: 2022-12-19 | Discharge: 2022-12-19 | Disposition: A | Discharge status: Home / Self Care | Payer: BC Managed Care – PPO | Attending: Student in an Organized Health Care Education/Training Program | Admitting: Student in an Organized Health Care Education/Training Program

## 2022-12-19 ENCOUNTER — Other Ambulatory Visit: Payer: Self-pay

## 2022-12-19 DIAGNOSIS — Z20822 Contact with and (suspected) exposure to covid-19: Secondary | ICD-10-CM | POA: Insufficient documentation

## 2022-12-19 DIAGNOSIS — B9789 Other viral agents as the cause of diseases classified elsewhere: Secondary | ICD-10-CM | POA: Insufficient documentation

## 2022-12-19 DIAGNOSIS — J069 Acute upper respiratory infection, unspecified: Secondary | ICD-10-CM | POA: Insufficient documentation

## 2022-12-19 DIAGNOSIS — R059 Cough, unspecified: Secondary | ICD-10-CM | POA: Diagnosis not present

## 2022-12-19 LAB — RESP PANEL BY RT-PCR (RSV, FLU A&B, COVID)  RVPGX2
Influenza A by PCR: NEGATIVE
Influenza B by PCR: NEGATIVE
Resp Syncytial Virus by PCR: NEGATIVE
SARS Coronavirus 2 by RT PCR: NEGATIVE

## 2022-12-19 MED ORDER — PSEUDOEPH-BROMPHEN-DM 30-2-10 MG/5ML PO SYRP: 5.0000 mL | ORAL_SOLUTION | Freq: Four times a day (QID) | ORAL | 0 refills | Status: AC | PRN

## 2022-12-19 NOTE — ED Provider Notes (Signed)
[facility] Provider Note    Event Date/Time   First MD Initiated Contact with Patient 12/19/22 1220     (approximate)   History   URI   HPI  Calvin Castro is a 42 y.o. male   Modena Jansky to the ED with complaint of cough, chills, fatigue that began 3 days ago.  Patient reports he also had a URI 2 weeks ago and that his daughter has been sick.  He states that on Monday he suddenly began having symptoms.  Patient has history of IBS, anxiety, depression.      Physical Exam   Triage Vital Signs: ED Triage Vitals  Encounter Vitals Group     BP 12/19/22 1205 127/75     Systolic BP Percentile --      Diastolic BP Percentile --      Pulse Rate 12/19/22 1205 86     Resp 12/19/22 1205 18     Temp 12/19/22 1205 99 F (37.2 C)     Temp Source 12/19/22 1205 Oral     SpO2 12/19/22 1205 98 %     Weight 12/19/22 1204 165 lb (74.8 kg)     Height 12/19/22 1204 5\' 8"  (1.727 m)     Head Circumference --      Peak Flow --      Pain Score 12/19/22 1203 7     Pain Loc --      Pain Education --      Exclude from Growth Chart --     Most recent vital signs: Vitals:   12/19/22 1205  BP: 127/75  Pulse: 86  Resp: 18  Temp: 99 F (37.2 C)  SpO2: 98%     General: Awake, no distress.  CV:  Good peripheral perfusion.  Right rate and rate rhythm. Resp:  Normal effort.  Lungs are clear bilaterally. Abd:  No distention.  Other:  Nasal congestion.   ED Results / Procedures / Treatments   Labs (all labs ordered are listed, but only abnormal results are displayed) Labs Reviewed  RESP PANEL BY RT-PCR (RSV, FLU A&B, COVID)  [id number]     PROCEDURES:  Critical Care performed:   Procedures   MEDICATIONS ORDERED IN ED: Medications - No data to display   IMPRESSION / MDM / ASSESSMENT AND PLAN / ED COURSE  I reviewed the triage vital signs and the nursing notes.   Differential diagnosis includes, but is not limited to, viral upper respiratory infection,  COVID, influenza, RSV.  42 year old male presents to the ED with complaint of 3 days cough, congestion and overall not feeling well.  Respiratory panel was negative and patient was made aware.  He will continue with fluids, Tylenol ibuprofen and a prescription for Bromfed-DM was sent to the pharmacy to take as needed for cough and congestion.  Patient is to follow-up with his PCP if any continued problems.  Also he requested a note for work.      Patient's presentation is most consistent with acute complicated illness / injury requiring diagnostic workup.  FINAL CLINICAL IMPRESSION(S) / ED DIAGNOSES   Final diagnoses:  Viral URI with cough     Rx / DC Orders   ED Discharge Orders          Ordered    brompheniramine-pseudoephedrine-DM 30-2-10 MG/5ML syrup  4 times daily PRN        12/19/22 1352             Note:  This document  was prepared using Conservation officer, historic buildings and may include unintentional dictation errors.   Tommi Rumps, PA-C 12/19/22 1358    Willy Eddy, MD 12/19/22 [phone]

## 2022-12-19 NOTE — Discharge Instructions (Signed)
Follow-up with your primary care provider if any continued problems or concerns.  Increase fluids to stay hydrated.  Tylenol or ibuprofen as needed for body aches, fever or headache.  A prescription for Bromfed-DM was sent to the pharmacy for you to take as needed for cough and congestion.

## 2022-12-19 NOTE — ED Triage Notes (Signed)
Pt via POV from home. Pt c/o cough, chills, and fatigue since Monday. Reports treated with a URI 2 weeks ago, Daughter has also been sick. Pt is A&OX4 and NAD

## 2023-11-14 ENCOUNTER — Ambulatory Visit: Payer: Self-pay | Admitting: Podiatry

## 2023-11-14 DIAGNOSIS — L989 Disorder of the skin and subcutaneous tissue, unspecified: Secondary | ICD-10-CM

## 2023-11-14 NOTE — Progress Notes (Signed)
  Subjective:  Patient ID: Calvin Castro, male    DOB: November 19, 1980,  MRN: [id number]  Chief Complaint  Patient presents with   Plantar Warts    43 y.o. male presents with the above complaint.  Patient presents with right submetatarsal 2 left midfoot and left heel plantar verruca with pinpoint bleeding he states been for quite some time wanted to discuss treatment options for obtaining this for next last few months.  Wanted get it evaluated failed over-the-counter options.  Pain scale is 2 out of 10 dull aching nature.   Review of Systems: Negative except as noted in the HPI. Denies N/V/F/Ch.  Past Medical History:  Diagnosis Date   Hemorrhoids    History of IBS    Hx of diarrhea    Rectal bleeding    Seasonal allergies     Current Outpatient Medications:    brompheniramine-pseudoephedrine-DM 30-2-10 MG/5ML syrup, Take 5 mLs by mouth 4 (four) times daily as needed., Disp: 120 mL, Rfl: 0   cetirizine  (ZYRTEC ) 10 MG tablet, Take 1 tablet (10 mg total) by mouth daily., Disp: 30 tablet, Rfl: 11   escitalopram  (LEXAPRO ) 20 MG tablet, Take 1 tablet (20 mg total) by mouth daily., Disp: 90 tablet, Rfl: 3   methylPREDNISolone  (MEDROL  DOSEPAK) 4 MG TBPK tablet, Take as directed., Disp: 21 each, Rfl: 0  Social History   Tobacco Use  Smoking Status Never  Smokeless Tobacco Never    No Known Allergies Objective:  There were no vitals filed for this visit. There is no height or weight on file to calculate BMI. Constitutional Well developed. Well nourished.  Vascular Dorsalis pedis pulses palpable bilaterally. Posterior tibial pulses palpable bilaterally. Capillary refill normal to all digits.  No cyanosis or clubbing noted. Pedal hair growth normal.  Neurologic Normal speech. Oriented to person, place, and time. Epicritic sensation to light touch grossly present bilaterally.  Dermatologic Right submetatarsal 2 left midfoot and left heel plantar verruca with pinpoint bleeding benign  skin lesion noted pain on palpation to the lesion no central nucleated core noted  Orthopedic: Normal joint ROM without pain or crepitus bilaterally. No visible deformities. No bony tenderness.   Radiographs: None Assessment:   1. Benign skin lesion    Plan:  Patient was evaluated and treated and all questions answered.  Right submetatarsal 2 and left midfoot/heel plantar verruca --Lesion was debrided today without complications. Hemostasis was achieved and the area was cleaned. Cantharone was applied followed by an occlusive bandage. Post procedure complications were discussed. Monitor for signs or symptoms of infection and directed to call the office mainly should any occur.     No follow-ups on file.  Right submet 2 and left midfoot and heel plantar verruca x 3 Cantharone

## 2023-11-28 ENCOUNTER — Ambulatory Visit (INDEPENDENT_AMBULATORY_CARE_PROVIDER_SITE_OTHER): Admitting: Podiatry

## 2023-11-28 DIAGNOSIS — L989 Disorder of the skin and subcutaneous tissue, unspecified: Secondary | ICD-10-CM

## 2023-11-28 NOTE — Progress Notes (Signed)
  Subjective:  Patient ID: Calvin Castro, male    DOB: September 19, 1980,  MRN: [id number]  Chief Complaint  Patient presents with   Plantar Warts    43 y.o. male presents with the above complaint.  Patient presents for follow-up of bilateral foot plantar verruca he states doing better he was able to tolerate the reaction.  Denies any other acute complaints.   Review of Systems: Negative except as noted in the HPI. Denies N/V/F/Ch.  Past Medical History:  Diagnosis Date   Hemorrhoids    History of IBS    Hx of diarrhea    Rectal bleeding    Seasonal allergies     Current Outpatient Medications:    amitriptyline  (ELAVIL ) 25 MG tablet, Take 25 mg by mouth., Disp: , Rfl:    amoxicillin-clavulanate (AUGMENTIN) 875-125 MG tablet, Take 1 tablet by mouth., Disp: , Rfl:    brompheniramine-pseudoephedrine-DM 30-2-10 MG/5ML syrup, Take 5 mLs by mouth 4 (four) times daily as needed., Disp: 120 mL, Rfl: 0   cetirizine  (ZYRTEC ) 10 MG tablet, Take 1 tablet (10 mg total) by mouth daily., Disp: 30 tablet, Rfl: 11   escitalopram  (LEXAPRO ) 20 MG tablet, Take 1 tablet (20 mg total) by mouth daily., Disp: 90 tablet, Rfl: 3   methylPREDNISolone  (MEDROL  DOSEPAK) 4 MG TBPK tablet, Take as directed., Disp: 21 each, Rfl: 0  Social History   Tobacco Use  Smoking Status Never  Smokeless Tobacco Never    No Known Allergies Objective:  There were no vitals filed for this visit. There is no height or weight on file to calculate BMI. Constitutional Well developed. Well nourished.  Vascular Dorsalis pedis pulses palpable bilaterally. Posterior tibial pulses palpable bilaterally. Capillary refill normal to all digits.  No cyanosis or clubbing noted. Pedal hair growth normal.  Neurologic Normal speech. Oriented to person, place, and time. Epicritic sensation to light touch grossly present bilaterally.  Dermatologic Right submetatarsal 2 left midfoot and left heel plantar verruca with pinpoint bleeding  benign skin lesion noted pain on palpation to the lesion no central nucleated core noted  Orthopedic: Normal joint ROM without pain or crepitus bilaterally. No visible deformities. No bony tenderness.   Radiographs: None Assessment:   No diagnosis found.  Plan:  Patient was evaluated and treated and all questions answered.  Right submetatarsal 2 and left midfoot/heel plantar verruca~second application --Lesion was debrided today without complications. Hemostasis was achieved and the area was cleaned. Cantharone was applied followed by an occlusive bandage. Post procedure complications were discussed. Monitor for signs or symptoms of infection and directed to call the office mainly should any occur. - He wants to hold off on orthotics due to financial reasons     No follow-ups on file.  Right submet 2 and left midfoot and heel plantar verruca x 3 Cantharone

## 2023-12-19 ENCOUNTER — Ambulatory Visit: Admitting: Podiatry

## 2023-12-19 DIAGNOSIS — L989 Disorder of the skin and subcutaneous tissue, unspecified: Secondary | ICD-10-CM | POA: Diagnosis not present

## 2023-12-19 NOTE — Progress Notes (Signed)
  Subjective:  Patient ID: Calvin Castro, male    DOB: 1980-11-02,  MRN: [id number]  Chief Complaint  Patient presents with   Plantar Warts    43 y.o. male presents with the above complaint.  Patient presents for follow-up of bilateral foot plantar verruca he states doing better he was able to tolerate the reaction.  Denies any other acute complaints.   Review of Systems: Negative except as noted in the HPI. Denies N/V/F/Ch.  Past Medical History:  Diagnosis Date   Hemorrhoids    History of IBS    Hx of diarrhea    Rectal bleeding    Seasonal allergies     Current Outpatient Medications:    amitriptyline  (ELAVIL ) 25 MG tablet, Take 25 mg by mouth., Disp: , Rfl:    amoxicillin-clavulanate (AUGMENTIN) 875-125 MG tablet, Take 1 tablet by mouth., Disp: , Rfl:    brompheniramine-pseudoephedrine-DM 30-2-10 MG/5ML syrup, Take 5 mLs by mouth 4 (four) times daily as needed., Disp: 120 mL, Rfl: 0   cetirizine  (ZYRTEC ) 10 MG tablet, Take 1 tablet (10 mg total) by mouth daily., Disp: 30 tablet, Rfl: 11   escitalopram  (LEXAPRO ) 20 MG tablet, Take 1 tablet (20 mg total) by mouth daily., Disp: 90 tablet, Rfl: 3   methylPREDNISolone  (MEDROL  DOSEPAK) 4 MG TBPK tablet, Take as directed., Disp: 21 each, Rfl: 0  Social History   Tobacco Use  Smoking Status Never  Smokeless Tobacco Never    No Known Allergies Objective:  There were no vitals filed for this visit. There is no height or weight on file to calculate BMI. Constitutional Well developed. Well nourished.  Vascular Dorsalis pedis pulses palpable bilaterally. Posterior tibial pulses palpable bilaterally. Capillary refill normal to all digits.  No cyanosis or clubbing noted. Pedal hair growth normal.  Neurologic Normal speech. Oriented to person, place, and time. Epicritic sensation to light touch grossly present bilaterally.  Dermatologic Right submetatarsal 2 has clinically healed.  No further plantar verruca noted.  Left  midfoot and left heel plantar verruca with pinpoint bleeding benign skin lesion noted pain on palpation to the lesion no central which is improving nucleated core noted  Orthopedic: Normal joint ROM without pain or crepitus bilaterally. No visible deformities. No bony tenderness.   Radiographs: None Assessment:   1. Benign skin lesion     Plan:  Patient was evaluated and treated and all questions answered.  Right submet 2 plantar verruca - Clinically healed   left midfoot/heel plantar verruca~third application --Lesion was debrided today without complications. Hemostasis was achieved and the area was cleaned. Cantharone was applied followed by an occlusive bandage. Post procedure complications were discussed. Monitor for signs or symptoms of infection and directed to call the office mainly should any occur. - He wants to hold off on orthotics due to financial reasons     No follow-ups on file.  Right submet 2 and left midfoot and heel plantar verruca x 3 Cantharone
